# Patient Record
Sex: Female | Born: 1975 | Race: White | Hispanic: No | Smoking: Former smoker
Health system: Southern US, Community
[De-identification: ages and names within clinical notes are randomized; demographics above are authoritative.]

## PROBLEM LIST (undated history)

## (undated) HISTORY — PX: TONSILLECTOMY: SUR1361

## (undated) HISTORY — PX: AUGMENTATION MAMMAPLASTY: SUR837

## (undated) HISTORY — PX: CERVIX SURGERY: SHX593

## (undated) HISTORY — PX: OVARIAN CYST REMOVAL: SHX89

---

## 1999-09-22 ENCOUNTER — Other Ambulatory Visit: Admission: RE | Admit: 1999-09-22 | Discharge: 1999-09-22 | Payer: Self-pay | Admitting: Obstetrics and Gynecology

## 2000-01-17 ENCOUNTER — Inpatient Hospital Stay (HOSPITAL_COMMUNITY): Admission: AD | Admit: 2000-01-17 | Discharge: 2000-01-17 | Payer: Self-pay | Admitting: Gynecology

## 2000-01-26 ENCOUNTER — Other Ambulatory Visit: Admission: RE | Admit: 2000-01-26 | Discharge: 2000-01-26 | Payer: Self-pay | Admitting: Gynecology

## 2000-02-11 ENCOUNTER — Encounter: Payer: Self-pay | Admitting: Gynecology

## 2000-02-11 ENCOUNTER — Inpatient Hospital Stay (HOSPITAL_COMMUNITY): Admission: AD | Admit: 2000-02-11 | Discharge: 2000-02-11 | Payer: Self-pay | Admitting: Gynecology

## 2000-08-25 ENCOUNTER — Inpatient Hospital Stay (HOSPITAL_COMMUNITY): Admission: AD | Admit: 2000-08-25 | Discharge: 2000-08-26 | Payer: Self-pay | Admitting: Gynecology

## 2000-10-19 ENCOUNTER — Emergency Department (HOSPITAL_COMMUNITY): Admission: EM | Admit: 2000-10-19 | Discharge: 2000-10-20 | Payer: Self-pay | Admitting: Emergency Medicine

## 2000-12-02 ENCOUNTER — Other Ambulatory Visit: Admission: RE | Admit: 2000-12-02 | Discharge: 2000-12-02 | Payer: Self-pay | Admitting: Gynecology

## 2002-12-31 ENCOUNTER — Other Ambulatory Visit: Admission: RE | Admit: 2002-12-31 | Discharge: 2002-12-31 | Payer: Self-pay | Admitting: Gynecology

## 2004-06-16 ENCOUNTER — Other Ambulatory Visit: Admission: RE | Admit: 2004-06-16 | Discharge: 2004-06-16 | Payer: Self-pay | Admitting: Gynecology

## 2005-09-15 ENCOUNTER — Other Ambulatory Visit: Admission: RE | Admit: 2005-09-15 | Discharge: 2005-09-15 | Payer: Self-pay | Admitting: Gynecology

## 2005-11-18 ENCOUNTER — Ambulatory Visit (HOSPITAL_BASED_OUTPATIENT_CLINIC_OR_DEPARTMENT_OTHER): Admission: RE | Admit: 2005-11-18 | Discharge: 2005-11-18 | Payer: Self-pay | Admitting: Gynecology

## 2006-09-20 ENCOUNTER — Other Ambulatory Visit: Admission: RE | Admit: 2006-09-20 | Discharge: 2006-09-20 | Payer: Self-pay | Admitting: Gynecology

## 2013-01-13 LAB — HM PAP SMEAR

## 2013-09-25 ENCOUNTER — Ambulatory Visit (INDEPENDENT_AMBULATORY_CARE_PROVIDER_SITE_OTHER): Payer: 59 | Admitting: Family Medicine

## 2013-09-25 ENCOUNTER — Encounter: Payer: Self-pay | Admitting: Family Medicine

## 2013-09-25 VITALS — BP 108/74 | HR 83 | Temp 98.5°F | Wt 125.4 lb

## 2013-09-25 DIAGNOSIS — H10021 Other mucopurulent conjunctivitis, right eye: Secondary | ICD-10-CM

## 2013-09-25 DIAGNOSIS — H10029 Other mucopurulent conjunctivitis, unspecified eye: Secondary | ICD-10-CM

## 2013-09-25 MED ORDER — MOXIFLOXACIN HCL 0.5 % OP SOLN: 1.0000 [drp] | Freq: Three times a day (TID) | OPHTHALMIC | Status: DC

## 2013-09-25 NOTE — Progress Notes (Signed)
  Subjective:    Nicole Drake is a 37 y.o. female who presents for evaluation of discharge, erythema and itching in the right eye. She has noticed the above symptoms for a few days. Onset was gradual. Patient denies blurred vision, foreign body sensation, pain, photophobia, tearing and visual field deficit. There is a history of contact lens use.  The following portions of the patient's history were reviewed and updated as appropriate: allergies, current medications, past family history, past medical history, past social history, past surgical history and problem list.  Review of Systems Pertinent items are noted in HPI.   Objective:    BP 108/74  Pulse 83  Temp(Src) 98.5 F (36.9 C) (Oral)  Wt 125 lb 6.4 oz (56.881 kg)  SpO2 99%      General: alert, cooperative, appears stated age and no distress  Eyes:  positive findings: sclera injected with d/c   Vision: Not performed  Fluorescein:  not done---because pt had contacts in and no case     Assessment:    Acute conjunctivitis   Plan:    Discussed the diagnosis and proper care of conjunctivitis.  Stressed household Presenter, broadcasting. Ophthalmic drops per orders. Warm compress to eye(s). Local eye care discussed.  If no better or worse in 2-3 days --refer to oph

## 2013-09-25 NOTE — Patient Instructions (Signed)
Bacterial Conjunctivitis  Bacterial conjunctivitis, commonly called pink eye, is an inflammation of the clear membrane that covers the white part of the eye (conjunctiva). The inflammation can also happen on the underside of the eyelids. The blood vessels in the conjunctiva become inflamed causing the eye to become red or pink. Bacterial conjunctivitis may spread easily from one eye to another and from person to person (contagious).   CAUSES   Bacterial conjunctivitis is caused by bacteria. The bacteria may come from your own skin, your upper respiratory tract, or from someone else with bacterial conjunctivitis.  SYMPTOMS   The normally white color of the eye or the underside of the eyelid is usually pink or red. The pink eye is usually associated with irritation, tearing, and some sensitivity to light. Bacterial conjunctivitis is often associated with a thick, yellowish discharge from the eye. The discharge may turn into a crust on the eyelids overnight, which causes your eyelids to stick together. If a discharge is present, there may also be some blurred vision in the affected eye.  DIAGNOSIS   Bacterial conjunctivitis is diagnosed by your caregiver through an eye exam and the symptoms that you report. Your caregiver looks for changes in the surface tissues of your eyes, which may point to the specific type of conjunctivitis. A sample of any discharge may be collected on a cotton-tip swab if you have a severe case of conjunctivitis, if your cornea is affected, or if you keep getting repeat infections that do not respond to treatment. The sample will be sent to a lab to see if the inflammation is caused by a bacterial infection and to see if the infection will respond to antibiotic medicines.  TREATMENT   · Bacterial conjunctivitis is treated with antibiotics. Antibiotic eyedrops are most often used. However, antibiotic ointments are also available. Antibiotics pills are sometimes used. Artificial tears or eye  washes may ease discomfort.  HOME CARE INSTRUCTIONS   · To ease discomfort, apply a cool, clean wash cloth to your eye for 10 20 minutes, 3 4 times a day.  · Gently wipe away any drainage from your eye with a warm, wet washcloth or a cotton ball.  · Wash your hands often with soap and water. Use paper towels to dry your hands.  · Do not share towels or wash cloths. This may spread the infection.  · Change or wash your pillow case every day.  · You should not use eye makeup until the infection is gone.  · Do not operate machinery or drive if your vision is blurred.  · Stop using contacts lenses. Ask your caregiver how to sterilize or replace your contacts before using them again. This depends on the type of contact lenses that you use.  · When applying medicine to the infected eye, do not touch the edge of your eyelid with the eyedrop bottle or ointment tube.  SEEK IMMEDIATE MEDICAL CARE IF:   · Your infection has not improved within 3 days after beginning treatment.  · You had yellow discharge from your eye and it returns.  · You have increased eye pain.  · Your eye redness is spreading.  · Your vision becomes blurred.  · You have a fever or persistent symptoms for more than 2 3 days.  · You have a fever and your symptoms suddenly get worse.  · You have facial pain, redness, or swelling.  MAKE SURE YOU:   · Understand these instructions.  · Will watch your   condition.  · Will get help right away if you are not doing well or get worse.  Document Released: 11/29/2005 Document Revised: 08/23/2012 Document Reviewed: 05/01/2012  [employer]® Patient Information ©2014 [employer].

## 2013-11-28 ENCOUNTER — Telehealth: Payer: Self-pay

## 2013-11-28 NOTE — Telephone Encounter (Signed)
Medication and allergies:  Reviewed and updated  90 day supply/mail order: na Local pharmacy: [facility]   Immunizations due:  Declines flu vaccine  A/P:   History Entered as indicated Pap--< 1 year ago--[facility] [place] Tdap--unsure of last one  To Discuss with Provider: Not at this time

## 2013-11-29 ENCOUNTER — Ambulatory Visit (INDEPENDENT_AMBULATORY_CARE_PROVIDER_SITE_OTHER): Payer: 59 | Admitting: Family Medicine

## 2013-11-29 ENCOUNTER — Encounter: Payer: Self-pay | Admitting: Family Medicine

## 2013-11-29 VITALS — BP 92/60 | HR 70 | Temp 98.1°F | Ht 63.0 in | Wt 125.0 lb

## 2013-11-29 DIAGNOSIS — Z0000 Encounter for general adult medical examination without abnormal findings: Secondary | ICD-10-CM

## 2013-11-29 DIAGNOSIS — N390 Urinary tract infection, site not specified: Secondary | ICD-10-CM

## 2013-11-29 DIAGNOSIS — F17200 Nicotine dependence, unspecified, uncomplicated: Secondary | ICD-10-CM

## 2013-11-29 LAB — POCT URINALYSIS DIPSTICK
Bilirubin, UA: NEGATIVE
Glucose, UA: NEGATIVE
Ketones, UA: NEGATIVE
Leukocytes, UA: NEGATIVE
Nitrite, UA: NEGATIVE
Protein, UA: NEGATIVE
Spec Grav, UA: <=1.005
Urobilinogen, UA: 0.2
pH, UA: 6

## 2013-11-29 LAB — HEPATIC FUNCTION PANEL
ALT: 15 U/L (ref 0–35)
AST: 20 U/L (ref 0–37)
Albumin: 4.5 g/dL (ref 3.5–5.2)
Alkaline Phosphatase: 37 U/L — ABNORMAL LOW (ref 39–117)
Bilirubin, Direct: 0.1 mg/dL (ref 0.0–0.3)
Total Bilirubin: 0.6 mg/dL (ref 0.3–1.2)
Total Protein: 7.5 g/dL (ref 6.0–8.3)

## 2013-11-29 LAB — LIPID PANEL
Cholesterol: 134 mg/dL (ref 0–200)
HDL: 57.3 mg/dL (ref >39.00)
LDL Cholesterol: 66 mg/dL (ref 0–99)
Total CHOL/HDL Ratio: 2
Triglycerides: 53 mg/dL (ref 0.0–149.0)
VLDL: 10.6 mg/dL (ref 0.0–40.0)

## 2013-11-29 LAB — CBC WITH DIFFERENTIAL/PLATELET
Basophils Absolute: 0 10*3/uL (ref 0.0–0.1)
Basophils Relative: 0.9 % (ref 0.0–3.0)
Eosinophils Absolute: 0.1 10*3/uL (ref 0.0–0.7)
Eosinophils Relative: 2.2 % (ref 0.0–5.0)
HCT: 37.9 % (ref 36.0–46.0)
Hemoglobin: 13.1 g/dL (ref 12.0–15.0)
Lymphocytes Relative: 33.6 % (ref 12.0–46.0)
Lymphs Abs: 1.4 10*3/uL (ref 0.7–4.0)
MCHC: 34.7 g/dL (ref 30.0–36.0)
MCV: 94.5 fl (ref 78.0–100.0)
Monocytes Absolute: 0.5 10*3/uL (ref 0.1–1.0)
Monocytes Relative: 10.9 % (ref 3.0–12.0)
Neutro Abs: 2.2 10*3/uL (ref 1.4–7.7)
Neutrophils Relative %: 52.4 % (ref 43.0–77.0)
Platelets: 242 10*3/uL (ref 150.0–400.0)
RBC: 4.01 Mil/uL (ref 3.87–5.11)
RDW: 12.5 % (ref 11.5–14.6)
WBC: 4.2 10*3/uL — ABNORMAL LOW (ref 4.5–10.5)

## 2013-11-29 LAB — BASIC METABOLIC PANEL
BUN: 13 mg/dL (ref 6–23)
CO2: 26 mEq/L (ref 19–32)
Calcium: 9.1 mg/dL (ref 8.4–10.5)
Chloride: 106 mEq/L (ref 96–112)
Creatinine, Ser: 0.7 mg/dL (ref 0.4–1.2)
GFR: 108.02 mL/min (ref >60.00)
Glucose, Bld: 79 mg/dL (ref 70–99)
Potassium: 3.7 mEq/L (ref 3.5–5.1)
Sodium: 139 mEq/L (ref 135–145)

## 2013-11-29 LAB — TSH: TSH: 1.65 u[IU]/mL (ref 0.35–5.50)

## 2013-11-29 NOTE — Progress Notes (Signed)
Pre visit review using our clinic review tool, if applicable. No additional management support is needed unless otherwise documented below in the visit note. 

## 2013-11-29 NOTE — Progress Notes (Signed)
Subjective:     Nicole Drake is a 37 y.o. female and is here for a comprehensive physical exam. The patient reports no problems.  History   Social History  . Marital Status: Divorced    Spouse Name: N/A    Number of Children: N/A  . Years of Education: N/A   Occupational History  . Not on file.   Social History Main Topics  . Smoking status: Current Every Day Smoker -- 0.25 packs/day for 18 years    Types: Cigarettes  . Smokeless tobacco: Never Used  . Alcohol Use: Yes     Comment: OCC  . Drug Use: No  . Sexual Activity: Not on file   Other Topics Concern  . Not on file   Social History Narrative  . No narrative on file   Health Maintenance  Topic Date Due  . Influenza Vaccine  11/29/2014  . Tetanus/tdap  12/14/2015  . Pap Smear  01/14/2016      She  reports that she has been smoking Cigarettes.  She has a 4.5 pack-year smoking history. She has never used smokeless tobacco. She reports that she drinks alcohol. She reports that she does not use illicit drugs. She currently has no medications in their medication list. No current outpatient prescriptions on file prior to visit.   No current facility-administered medications on file prior to visit.   She is allergic to penicillins..  Review of Systems Review of Systems  Constitutional: Negative for activity change, appetite change and fatigue.  HENT: Negative for hearing loss, congestion, tinnitus and ear discharge.  dentist q23m Eyes: Negative for visual disturbance (see optho q1y -- vision corrected to 20/20 with glasses).  Respiratory: Negative for cough, chest tightness and shortness of breath.   Cardiovascular: Negative for chest pain, palpitations and leg swelling.  Gastrointestinal: Negative for abdominal pain, diarrhea, constipation and abdominal distention.  Genitourinary: Negative for urgency, frequency, decreased urine volume and difficulty urinating.  Musculoskeletal: Negative for back pain,  arthralgias and gait problem.  Skin: Negative for color change, pallor and rash.  Neurological: Negative for dizziness, light-headedness, numbness and headaches.  Hematological: Negative for adenopathy. Does not bruise/bleed easily.  Psychiatric/Behavioral: Negative for suicidal ideas, confusion, sleep disturbance, self-injury, dysphoric mood, decreased concentration and agitation.       Objective:    BP 92/60  Pulse 70  Temp(Src) 98.1 F (36.7 C) (Oral)  Ht 5\' 3"  (1.6 m)  Wt 125 lb (56.7 kg)  BMI 22.15 kg/m2  SpO2 98% General appearance: alert, cooperative, appears stated age and no distress Head: Normocephalic, without obvious abnormality, atraumatic Eyes: conjunctivae/corneas clear. PERRL, EOM's intact. Fundi benign. Ears: normal TM's and external ear canals both ears Nose: Nares normal. Septum midline. Mucosa normal. No drainage or sinus tenderness. Throat: lips, mucosa, and tongue normal; teeth and gums normal Neck: no adenopathy, no carotid bruit, no JVD, supple, symmetrical, trachea midline and thyroid not enlarged, symmetric, no tenderness/mass/nodules Back: symmetric, no curvature. ROM normal. No CVA tenderness. Lungs: clear to auscultation bilaterally Breasts: gyn Heart: regular rate and rhythm, S1, S2 normal, no murmur, click, rub or gallop Abdomen: soft, non-tender; bowel sounds normal; no masses,  no organomegaly Pelvic: deferred--gyn Extremities: extremities normal, atraumatic, no cyanosis or edema Pulses: 2+ and symmetric Skin: Skin color, texture, turgor normal. No rashes or lesions Lymph nodes: Cervical, supraclavicular, and axillary nodes normal. Neurologic: Alert and oriented X 3, normal strength and tone. Normal symmetric reflexes. Normal coordination and gait Psych-- no depression, no anxiety  Assessment:    Healthy female exam.      Plan:    ghm utd Check labs See After Visit Summary for Counseling Recommendations

## 2013-11-29 NOTE — Addendum Note (Signed)
Addended by: Silvio Pate D on: 11/29/2013 02:51 PM   Modules accepted: Orders

## 2013-11-29 NOTE — Patient Instructions (Addendum)
Preventive Care for Adults, Female A healthy lifestyle and preventive care can promote health and wellness. Preventive health guidelines for women include the following key practices.  A routine yearly physical is a good way to check with your caregiver about your health and preventive screening. It is a chance to share any concerns and updates on your health, and to receive a thorough exam.  Visit your dentist for a routine exam and preventive care every 6 months. Brush your teeth twice a day and floss once a day. Good oral hygiene prevents tooth decay and gum disease.  The frequency of eye exams is based on your age, health, family medical history, use of contact lenses, and other factors. Follow your caregiver's recommendations for frequency of eye exams.  Eat a healthy diet. Foods like vegetables, fruits, whole grains, low-fat dairy products, and lean protein foods contain the nutrients you need without too many calories. Decrease your intake of foods high in solid fats, added sugars, and salt. Eat the right amount of calories for you.Get information about a proper diet from your caregiver, if necessary.  Regular physical exercise is one of the most important things you can do for your health. Most adults should get at least 150 minutes of moderate-intensity exercise (any activity that increases your heart rate and causes you to sweat) each week. In addition, most adults need muscle-strengthening exercises on 2 or more days a week.  Maintain a healthy weight. The body mass index (BMI) is a screening tool to identify possible weight problems. It provides an estimate of body fat based on height and weight. Your caregiver can help determine your BMI, and can help you achieve or maintain a healthy weight.For adults 20 years and older:  A BMI below 18.5 is considered underweight.  A BMI of 18.5 to 24.9 is normal.  A BMI of 25 to 29.9 is considered overweight.  A BMI of 30 and above is  considered obese.  Maintain normal blood lipids and cholesterol levels by exercising and minimizing your intake of saturated fat. Eat a balanced diet with plenty of fruit and vegetables. Blood tests for lipids and cholesterol should begin at age 20 and be repeated every 5 years. If your lipid or cholesterol levels are high, you are over 50, or you are at high risk for heart disease, you may need your cholesterol levels checked more frequently.Ongoing high lipid and cholesterol levels should be treated with medicines if diet and exercise are not effective.  If you smoke, find out from your caregiver how to quit. If you do not use tobacco, do not start.  Lung cancer screening is recommended for adults aged 55 80 years who are at high risk for developing lung cancer because of a history of smoking. Yearly low-dose computed tomography (CT) is recommended for people who have at least a 30-pack-year history of smoking and are a current smoker or have quit within the past 15 years. A pack year of smoking is smoking an average of 1 pack of cigarettes a day for 1 year (for example: 1 pack a day for 30 years or 2 packs a day for 15 years). Yearly screening should continue until the smoker has stopped smoking for at least 15 years. Yearly screening should also be stopped for people who develop a health problem that would prevent them from having lung cancer treatment.  If you are pregnant, do not drink alcohol. If you are breastfeeding, be very cautious about drinking alcohol. If you are   not pregnant and choose to drink alcohol, do not exceed 1 drink per day. One drink is considered to be 12 ounces (355 mL) of beer, 5 ounces (148 mL) of wine, or 1.5 ounces (44 mL) of liquor.  Avoid use of street drugs. Do not share needles with anyone. Ask for help if you need support or instructions about stopping the use of drugs.  High blood pressure causes heart disease and increases the risk of stroke. Your blood pressure  should be checked at least every 1 to 2 years. Ongoing high blood pressure should be treated with medicines if weight loss and exercise are not effective.  If you are 55 to 38 years old, ask your caregiver if you should take aspirin to prevent strokes.  Diabetes screening involves taking a blood sample to check your fasting blood sugar level. This should be done once every 3 years, after age 45, if you are within normal weight and without risk factors for diabetes. Testing should be considered at a younger age or be carried out more frequently if you are overweight and have at least 1 risk factor for diabetes.  Breast cancer screening is essential preventive care for women. You should practice "breast self-awareness." This means understanding the normal appearance and feel of your breasts and may include breast self-examination. Any changes detected, no matter how small, should be reported to a caregiver. Women in their 20s and 30s should have a clinical breast exam (CBE) by a caregiver as part of a regular health exam every 1 to 3 years. After age 40, women should have a CBE every year. Starting at age 40, women should consider having a mammography (breast X-ray test) every year. Women who have a family history of breast cancer should talk to their caregiver about genetic screening. Women at a high risk of breast cancer should talk to their caregivers about having magnetic resonance imaging (MRI) and a mammography every year.  Breast cancer gene (BRCA)-related cancer risk assessment is recommended for women who have family members with BRCA-related cancers. BRCA-related cancers include breast, ovarian, tubal, and peritoneal cancers. Having family members with these cancers may be associated with an increased risk for harmful changes (mutations) in the breast cancer genes BRCA1 and BRCA2. Results of the assessment will determine the need for genetic counseling and BRCA1 and BRCA2 testing.  The Pap test is  a screening test for cervical cancer. A Pap test can show cell changes on the cervix that might become cervical cancer if left untreated. A Pap test is a procedure in which cells are obtained and examined from the lower end of the uterus (cervix).  Women should have a Pap test starting at age 21.  Between ages 21 and 29, Pap tests should be repeated every 2 years.  Beginning at age 30, you should have a Pap test every 3 years as long as the past 3 Pap tests have been normal.  Some women have medical problems that increase the chance of getting cervical cancer. Talk to your caregiver about these problems. It is especially important to talk to your caregiver if a new problem develops soon after your last Pap test. In these cases, your caregiver may recommend more frequent screening and Pap tests.  The above recommendations are the same for women who have or have not gotten the vaccine for human papillomavirus (HPV).  If you had a hysterectomy for a problem that was not cancer or a condition that could lead to cancer, then   you no longer need Pap tests. Even if you no longer need a Pap test, a regular exam is a good idea to make sure no other problems are starting.  If you are between ages 79 and 19, and you have had normal Pap tests going back 10 years, you no longer need Pap tests. Even if you no longer need a Pap test, a regular exam is a good idea to make sure no other problems are starting.  If you have had past treatment for cervical cancer or a condition that could lead to cancer, you need Pap tests and screening for cancer for at least 20 years after your treatment.  If Pap tests have been discontinued, risk factors (such as a new sexual partner) need to be reassessed to determine if screening should be resumed.  The HPV test is an additional test that may be used for cervical cancer screening. The HPV test looks for the virus that can cause the cell changes on the cervix. The cells collected  during the Pap test can be tested for HPV. The HPV test could be used to screen women aged 90 years and older, and should be used in women of any age who have unclear Pap test results. After the age of 9, women should have HPV testing at the same frequency as a Pap test.  Colorectal cancer can be detected and often prevented. Most routine colorectal cancer screening begins at the age of 63 and continues through age 61. However, your caregiver may recommend screening at an earlier age if you have risk factors for colon cancer. On a yearly basis, your caregiver may provide home test kits to check for hidden blood in the stool. Use of a small camera at the end of a tube, to directly examine the colon (sigmoidoscopy or colonoscopy), can detect the earliest forms of colorectal cancer. Talk to your caregiver about this at age 32, when routine screening begins. Direct examination of the colon should be repeated every 5 to 10 years through age 7, unless early forms of pre-cancerous polyps or small growths are found.  Hepatitis C blood testing is recommended for all people born from [birth year] through 1965 and any individual with known risks for hepatitis C.  Practice safe sex. Use condoms and avoid high-risk sexual practices to reduce the spread of sexually transmitted infections (STIs). STIs include gonorrhea, chlamydia, syphilis, trichomonas, herpes, HPV, and human immunodeficiency virus (HIV). Herpes, HIV, and HPV are viral illnesses that have no cure. They can result in disability, cancer, and death. Sexually active women aged 7 and younger should be checked for chlamydia. Older women with new or multiple partners should also be tested for chlamydia. Testing for other STIs is recommended if you are sexually active and at increased risk.  Osteoporosis is a disease in which the bones lose minerals and strength with aging. This can result in serious bone fractures. The risk of osteoporosis can be identified using a  bone density scan. Women ages 30 and over and women at risk for fractures or osteoporosis should discuss screening with their caregivers. Ask your caregiver whether you should take a calcium supplement or vitamin D to reduce the rate of osteoporosis.  Menopause can be associated with physical symptoms and risks. Hormone replacement therapy is available to decrease symptoms and risks. You should talk to your caregiver about whether hormone replacement therapy is right for you.  Use sunscreen. Apply sunscreen liberally and repeatedly throughout the day. You should seek shade  when your shadow is shorter than you. Protect yourself by wearing long sleeves, pants, a wide-brimmed hat, and sunglasses year round, whenever you are outdoors.  Once a month, do a whole body skin exam, using a mirror to look at the skin on your back. Notify your caregiver of new moles, moles that have irregular borders, moles that are larger than a pencil eraser, or moles that have changed in shape or color.  Stay current with required immunizations.  Influenza vaccine. All adults should be immunized every year.  Tetanus, diphtheria, and acellular pertussis (Td, Tdap) vaccine. Pregnant women should receive 1 dose of Tdap vaccine during each pregnancy. The dose should be obtained regardless of the length of time since the last dose. Immunization is preferred during the 27th to 36th week of gestation. An adult who has not previously received Tdap or who does not know her vaccine status should receive 1 dose of Tdap. This initial dose should be followed by tetanus and diphtheria toxoids (Td) booster doses every 10 years. Adults with an unknown or incomplete history of completing a 3-dose immunization series with Td-containing vaccines should begin or complete a primary immunization series including a Tdap dose. Adults should receive a Td booster every 10 years.  Varicella vaccine. An adult without evidence of immunity to varicella  should receive 2 doses or a second dose if she has previously received 1 dose. Pregnant females who do not have evidence of immunity should receive the first dose after pregnancy. This first dose should be obtained before leaving the health care facility. The second dose should be obtained 4 8 weeks after the first dose.  Human papillomavirus (HPV) vaccine. Females aged 13 26 years who have not received the vaccine previously should obtain the 3-dose series. The vaccine is not recommended for use in pregnant females. However, pregnancy testing is not needed before receiving a dose. If a female is found to be pregnant after receiving a dose, no treatment is needed. In that case, the remaining doses should be delayed until after the pregnancy. Immunization is recommended for any person with an immunocompromised condition through the age of 26 years if she did not get any or all doses earlier. During the 3-dose series, the second dose should be obtained 4 8 weeks after the first dose. The third dose should be obtained 24 weeks after the first dose and 16 weeks after the second dose.  Zoster vaccine. One dose is recommended for adults aged 60 years or older unless certain conditions are present.  Measles, mumps, and rubella (MMR) vaccine. Adults born before 1957 generally are considered immune to measles and mumps. Adults born in 1957 or later should have 1 or more doses of MMR vaccine unless there is a contraindication to the vaccine or there is laboratory evidence of immunity to each of the three diseases. A routine second dose of MMR vaccine should be obtained at least 28 days after the first dose for students attending postsecondary schools, health care workers, or international travelers. People who received inactivated measles vaccine or an unknown type of measles vaccine during 1963 1967 should receive 2 doses of MMR vaccine. People who received inactivated mumps vaccine or an unknown type of mumps vaccine  before 1979 and are at high risk for mumps infection should consider immunization with 2 doses of MMR vaccine. For females of childbearing age, rubella immunity should be determined. If there is no evidence of immunity, females who are not pregnant should be vaccinated. If there   is no evidence of immunity, females who are pregnant should delay immunization until after pregnancy. Unvaccinated health care workers born before 1957 who lack laboratory evidence of measles, mumps, or rubella immunity or laboratory confirmation of disease should consider measles and mumps immunization with 2 doses of MMR vaccine or rubella immunization with 1 dose of MMR vaccine.  Pneumococcal 13-valent conjugate (PCV13) vaccine. When indicated, a person who is uncertain of her immunization history and has no record of immunization should receive the PCV13 vaccine. An adult aged 19 years or older who has certain medical conditions and has not been previously immunized should receive 1 dose of PCV13 vaccine. This PCV13 should be followed with a dose of pneumococcal polysaccharide (PPSV23) vaccine. The PPSV23 vaccine dose should be obtained at least 8 weeks after the dose of PCV13 vaccine. An adult aged 19 years or older who has certain medical conditions and previously received 1 or more doses of PPSV23 vaccine should receive 1 dose of PCV13. The PCV13 vaccine dose should be obtained 1 or more years after the last PPSV23 vaccine dose.  Pneumococcal polysaccharide (PPSV23) vaccine. When PCV13 is also indicated, PCV13 should be obtained first. All adults aged 65 years and older should be immunized. An adult younger than age 65 years who has certain medical conditions should be immunized. Any person who resides in a nursing home or long-term care facility should be immunized. An adult smoker should be immunized. People with an immunocompromised condition and certain other conditions should receive both PCV13 and PPSV23 vaccines. People  with human immunodeficiency virus (HIV) infection should be immunized as soon as possible after diagnosis. Immunization during chemotherapy or radiation therapy should be avoided. Routine use of PPSV23 vaccine is not recommended for American Indians, [place] Natives, or people younger than 65 years unless there are medical conditions that require PPSV23 vaccine. When indicated, people who have unknown immunization and have no record of immunization should receive PPSV23 vaccine. One-time revaccination 5 years after the first dose of PPSV23 is recommended for people aged 19 64 years who have chronic kidney failure, nephrotic syndrome, asplenia, or immunocompromised conditions. People who received 1 2 doses of PPSV23 before age 65 years should receive another dose of PPSV23 vaccine at age 65 years or later if at least 5 years have passed since the previous dose. Doses of PPSV23 are not needed for people immunized with PPSV23 at or after age 65 years.  Meningococcal vaccine. Adults with asplenia or persistent complement component deficiencies should receive 2 doses of quadrivalent meningococcal conjugate (MenACWY-D) vaccine. The doses should be obtained at least 2 months apart. Microbiologists working with certain meningococcal bacteria, military recruits, people at risk during an outbreak, and people who travel to or live in countries with a high rate of meningitis should be immunized. A first-year college student up through age 21 years who is living in a residence hall should receive a dose if she did not receive a dose on or after her 16th birthday. Adults who have certain high-risk conditions should receive one or more doses of vaccine.  Hepatitis A vaccine. Adults who wish to be protected from this disease, have certain high-risk conditions, work with hepatitis A-infected animals, work in hepatitis A research labs, or travel to or work in countries with a high rate of hepatitis A should be immunized. Adults  who were previously unvaccinated and who anticipate close contact with an international adoptee during the first 60 days after arrival in the [place] from a country   with a high rate of hepatitis A should be immunized.  Hepatitis B vaccine. Adults who wish to be protected from this disease, have certain high-risk conditions, may be exposed to blood or other infectious body fluids, are household contacts or sex partners of hepatitis B positive people, are clients or workers in certain care facilities, or travel to or work in countries with a high rate of hepatitis B should be immunized.  Haemophilus influenzae type b (Hib) vaccine. A previously unvaccinated person with asplenia or sickle cell disease or having a scheduled splenectomy should receive 1 dose of Hib vaccine. Regardless of previous immunization, a recipient of a hematopoietic stem cell transplant should receive a 3-dose series 6 12 months after her successful transplant. Hib vaccine is not recommended for adults with HIV infection. Preventive Services / Frequency Ages 19 to 39  Blood pressure check.** / Every 1 to 2 years.  Lipid and cholesterol check.** / Every 5 years beginning at age 20.  Clinical breast exam.** / Every 3 years for women in their 20s and 30s.  BRCA-related cancer risk assessment.** / For women who have family members with a BRCA-related cancer (breast, ovarian, tubal, or peritoneal cancers).  Pap test.** / Every 2 years from ages 21 through 29. Every 3 years starting at age 30 through age 65 or 70 with a history of 3 consecutive normal Pap tests.  HPV screening.** / Every 3 years from ages 30 through ages 65 to 70 with a history of 3 consecutive normal Pap tests.  Hepatitis C blood test.** / For any individual with known risks for hepatitis C.  Skin self-exam. / Monthly.  Influenza vaccine. / Every year.  Tetanus, diphtheria, and acellular pertussis (Tdap, Td) vaccine.** / Consult your caregiver. Pregnant  women should receive 1 dose of Tdap vaccine during each pregnancy. 1 dose of Td every 10 years.  Varicella vaccine.** / Consult your caregiver. Pregnant females who do not have evidence of immunity should receive the first dose after pregnancy.  HPV vaccine. / 3 doses over 6 months, if 26 and younger. The vaccine is not recommended for use in pregnant females. However, pregnancy testing is not needed before receiving a dose.  Measles, mumps, rubella (MMR) vaccine.** / You need at least 1 dose of MMR if you were born in 1957 or later. You may also need a 2nd dose. For females of childbearing age, rubella immunity should be determined. If there is no evidence of immunity, females who are not pregnant should be vaccinated. If there is no evidence of immunity, females who are pregnant should delay immunization until after pregnancy.  Pneumococcal 13-valent conjugate (PCV13) vaccine.** / Consult your caregiver.  Pneumococcal polysaccharide (PPSV23) vaccine.** / 1 to 2 doses if you smoke cigarettes or if you have certain conditions.  Meningococcal vaccine.** / 1 dose if you are age 19 to 21 years and a first-year college student living in a residence hall, or have one of several medical conditions, you need to get vaccinated against meningococcal disease. You may also need additional booster doses.  Hepatitis A vaccine.** / Consult your caregiver.  Hepatitis B vaccine.** / Consult your caregiver.  Haemophilus influenzae type b (Hib) vaccine.** / Consult your caregiver. Ages 40 to 64  Blood pressure check.** / Every 1 to 2 years.  Lipid and cholesterol check.** / Every 5 years beginning at age 20.  Lung cancer screening. / Every year if you are aged 55 80 years and have a 30-pack-year history of smoking and   currently smoke or have quit within the past 15 years. Yearly screening is stopped once you have quit smoking for at least 15 years or develop a health problem that would prevent you from having  lung cancer treatment.  Clinical breast exam.** / Every year after age 40.  BRCA-related cancer risk assessment.** / For women who have family members with a BRCA-related cancer (breast, ovarian, tubal, or peritoneal cancers).  Mammogram.** / Every year beginning at age 40 and continuing for as long as you are in good health. Consult with your caregiver.  Pap test.** / Every 3 years starting at age 30 through age 65 or 70 with a history of 3 consecutive normal Pap tests.  HPV screening.** / Every 3 years from ages 30 through ages 65 to 70 with a history of 3 consecutive normal Pap tests.  Fecal occult blood test (FOBT) of stool. / Every year beginning at age 50 and continuing until age 75. You may not need to do this test if you get a colonoscopy every 10 years.  Flexible sigmoidoscopy or colonoscopy.** / Every 5 years for a flexible sigmoidoscopy or every 10 years for a colonoscopy beginning at age 50 and continuing until age 75.  Hepatitis C blood test.** / For all people born from 1945 through 1965 and any individual with known risks for hepatitis C.  Skin self-exam. / Monthly.  Influenza vaccine. / Every year.  Tetanus, diphtheria, and acellular pertussis (Tdap/Td) vaccine.** / Consult your caregiver. Pregnant women should receive 1 dose of Tdap vaccine during each pregnancy. 1 dose of Td every 10 years.  Varicella vaccine.** / Consult your caregiver. Pregnant females who do not have evidence of immunity should receive the first dose after pregnancy.  Zoster vaccine.** / 1 dose for adults aged 60 years or older.  Measles, mumps, rubella (MMR) vaccine.** / You need at least 1 dose of MMR if you were born in 1957 or later. You may also need a 2nd dose. For females of childbearing age, rubella immunity should be determined. If there is no evidence of immunity, females who are not pregnant should be vaccinated. If there is no evidence of immunity, females who are pregnant should delay  immunization until after pregnancy.  Pneumococcal 13-valent conjugate (PCV13) vaccine.** / Consult your caregiver.  Pneumococcal polysaccharide (PPSV23) vaccine.** / 1 to 2 doses if you smoke cigarettes or if you have certain conditions.  Meningococcal vaccine.** / Consult your caregiver.  Hepatitis A vaccine.** / Consult your caregiver.  Hepatitis B vaccine.** / Consult your caregiver.  Haemophilus influenzae type b (Hib) vaccine.** / Consult your caregiver. Ages 65 and over  Blood pressure check.** / Every 1 to 2 years.  Lipid and cholesterol check.** / Every 5 years beginning at age 20.  Lung cancer screening. / Every year if you are aged 55 80 years and have a 30-pack-year history of smoking and currently smoke or have quit within the past 15 years. Yearly screening is stopped once you have quit smoking for at least 15 years or develop a health problem that would prevent you from having lung cancer treatment.  Clinical breast exam.** / Every year after age 40.  BRCA-related cancer risk assessment.** / For women who have family members with a BRCA-related cancer (breast, ovarian, tubal, or peritoneal cancers).  Mammogram.** / Every year beginning at age 40 and continuing for as long as you are in good health. Consult with your caregiver.  Pap test.** / Every 3 years starting at age   30 through age 45 or 28 with a 3 consecutive normal Pap tests. Testing can be stopped between 65 and 70 with 3 consecutive normal Pap tests and no abnormal Pap or HPV tests in the past 10 years.  HPV screening.** / Every 3 years from ages 77 through ages 62 or 11 with a history of 3 consecutive normal Pap tests. Testing can be stopped between 65 and 70 with 3 consecutive normal Pap tests and no abnormal Pap or HPV tests in the past 10 years.  Fecal occult blood test (FOBT) of stool. / Every year beginning at age 35 and continuing until age 101. You may not need to do this test if you get a colonoscopy  every 10 years.  Flexible sigmoidoscopy or colonoscopy.** / Every 5 years for a flexible sigmoidoscopy or every 10 years for a colonoscopy beginning at age 37 and continuing until age 67.  Hepatitis C blood test.** / For all people born from [birth year] through 1966 and any individual with known risks for hepatitis C.  Osteoporosis screening.** / A one-time screening for women ages 77 and over and women at risk for fractures or osteoporosis.  Skin self-exam. / Monthly.  Influenza vaccine. / Every year.  Tetanus, diphtheria, and acellular pertussis (Tdap/Td) vaccine.** / 1 dose of Td every 10 years.  Varicella vaccine.** / Consult your caregiver.  Zoster vaccine.** / 1 dose for adults aged 69 years or older.  Pneumococcal 13-valent conjugate (PCV13) vaccine.** / Consult your caregiver.  Pneumococcal polysaccharide (PPSV23) vaccine.** / 1 dose for all adults aged 57 years and older.  Meningococcal vaccine.** / Consult your caregiver.  Hepatitis A vaccine.** / Consult your caregiver.  Hepatitis B vaccine.** / Consult your caregiver.  Haemophilus influenzae type b (Hib) vaccine.** / Consult your caregiver. ** Family history and personal history of risk and conditions may change your caregiver's recommendations. Document Released: 01/25/2002 Document Revised: 03/26/2013 Document Reviewed: 04/26/2011 [facility] Patient Information 2014 [place], [place].   Smoking Cessation, Tips for Success YOU CAN QUIT SMOKING If you are ready to quit smoking, congratulations! You have chosen to help yourself be healthier. Cigarettes bring nicotine, tar, carbon monoxide, and other irritants into your body. Your lungs, heart, and blood vessels will be able to work better without these poisons. There are many different ways to quit smoking. Nicotine gum, nicotine patches, a nicotine inhaler, or nicotine nasal spray can help with physical craving. Hypnosis, support groups, and medicines help break the habit of  smoking. Here are some tips to help you quit for good. Throw away all cigarettes. Clean and remove all ashtrays from your home, work, and car. On a card, write down your reasons for quitting. Carry the card with you and read it when you get the urge to smoke. Cleanse your body of nicotine. Drink enough water and fluids to keep your urine clear or pale yellow. Do this after quitting to flush the nicotine from your body. Learn to predict your moods. Do not let a bad situation be your excuse to have a cigarette. Some situations in your life might tempt you into wanting a cigarette. Never have "just one" cigarette. It leads to wanting another and another. Remind yourself of your decision to quit. Change habits associated with smoking. If you smoked while driving or when feeling stressed, try other activities to replace smoking. Stand up when drinking your coffee. Brush your teeth after eating. Sit in a different chair when you read the paper. Avoid alcohol while trying to quit,  and try to drink fewer caffeinated beverages. Alcohol and caffeine may urge you to smoke. Avoid foods and drinks that can trigger a desire to smoke, such as sugary or spicy foods and alcohol. Ask people who smoke not to smoke around you. Have something planned to do right after eating or having a cup of coffee. Take a walk or exercise to perk you up. This will help to keep you from overeating. Try a relaxation exercise to calm you down and decrease your stress. Remember, you may be tense and nervous for the first 2 weeks after you quit, but this will pass. Find new activities to keep your hands busy. Play with a pen, coin, or rubber band. Doodle or draw things on paper. Brush your teeth right after eating. This will help cut down on the craving for the taste of tobacco after meals. You can try mouthwash, too. Use oral substitutes, such as lemon drops, carrots, a cinnamon stick, or chewing gum, in place of cigarettes. Keep them handy  so they are available when you have the urge to smoke. When you have the urge to smoke, try deep breathing. Designate your home as a nonsmoking area. If you are a heavy smoker, ask your caregiver about a prescription for nicotine chewing gum. It can ease your withdrawal from nicotine. Reward yourself. Set aside the cigarette money you save and buy yourself something nice. Look for support from others. Join a support group or smoking cessation program. Ask someone at home or at work to help you with your plan to quit smoking. Always ask yourself, "Do I need this cigarette or is this just a reflex?" Tell yourself, "Today, I choose not to smoke," or "I do not want to smoke." You are reminding yourself of your decision to quit, even if you do smoke a cigarette. HOW WILL I FEEL WHEN I QUIT SMOKING? The benefits of not smoking start within days of quitting. You may have symptoms of withdrawal because your body is used to nicotine (the addictive substance in cigarettes). You may crave cigarettes, be irritable, feel very hungry, cough often, get headaches, or have difficulty concentrating. The withdrawal symptoms are only temporary. They are strongest when you first quit but will go away within 10 to 14 days. When withdrawal symptoms occur, stay in control. Think about your reasons for quitting. Remind yourself that these are signs that your body is healing and getting used to being without cigarettes. Remember that withdrawal symptoms are easier to treat than the major diseases that smoking can cause. Even after the withdrawal is over, expect periodic urges to smoke. However, these cravings are generally short-lived and will go away whether you smoke or not. Do not smoke! If you relapse and smoke again, do not lose hope. Most smokers quit 3 times before they are successful. If you relapse, do not give up! Plan ahead and think about what you will do the next time you get the urge to smoke. LIFE AS A  NONSMOKER: MAKE IT FOR A MONTH, MAKE IT FOR LIFE Day 1: Hang this page where you will see it every day. Day 2: Get rid of all ashtrays, matches, and lighters. Day 3: Drink water. Breathe deeply between sips. Day 4: Avoid places with smoke-filled air, such as bars, clubs, or the smoking section of restaurants. Day 5: Keep track of how much money you save by not smoking. Day 6: Avoid boredom. Keep a good book with you or go to the movies. Day 7:  Reward yourself! One week without smoking! Day 8: Make a dental appointment to get your teeth cleaned. Day 9: Decide how you will turn down a cigarette before it is offered to you. Day 10: Review your reasons for quitting. Day 11: Distract yourself. Stay active to keep your mind off smoking and to relieve tension. Take a walk, exercise, read a book, do a crossword puzzle, or try a new hobby. Day 12: Exercise. Get off the bus before your stop or use stairs instead of escalators. Day 13: Call on friends for support and encouragement. Day 14: Reward yourself! Two weeks without smoking! Day 15: Practice deep breathing exercises. Day 16: Bet a friend that you can stay a nonsmoker. Day 17: Ask to sit in nonsmoking sections of restaurants. Day 18: Hang up "No Smoking" signs. Day 19: Think of yourself as a nonsmoker. Day 20: Each morning, tell yourself you will not smoke. Day 21: Reward yourself! Three weeks without smoking! Day 22: Think of smoking in negative ways. Remember how it stains your teeth, gives you bad breath, and leaves you short of breath. Day 23: Eat a nutritious breakfast. Day 24:Do not relive your days as a smoker. Day 25: Hold a pencil in your hand when talking on the telephone. Day 26: Tell all your friends you do not smoke. Day 27: Think about how much better food tastes. Day 28: Remember, one cigarette is one too many. Day 29: Take up a hobby that will keep your hands busy. Day 30: Congratulations! One month without smoking! Give  yourself a big reward. Your caregiver can direct you to community resources or hospitals for support, which may include: Group support. Education. Hypnosis. Subliminal therapy. Document Released: 08/27/2004 Document Revised: 02/21/2012 Document Reviewed: 05/17/2013 [facility] Patient Information 2014 [place], [place].

## 2013-11-30 LAB — URINE CULTURE
Colony Count: NO GROWTH
Organism ID, Bacteria: NO GROWTH

## 2014-05-13 ENCOUNTER — Telehealth: Payer: Self-pay | Admitting: Family Medicine

## 2014-05-13 ENCOUNTER — Encounter: Payer: Self-pay | Admitting: Family Medicine

## 2014-05-13 ENCOUNTER — Ambulatory Visit (INDEPENDENT_AMBULATORY_CARE_PROVIDER_SITE_OTHER): Payer: 59 | Admitting: Family Medicine

## 2014-05-13 VITALS — BP 100/68 | HR 79 | Temp 98.6°F | Wt 125.0 lb

## 2014-05-13 DIAGNOSIS — H18829 Corneal disorder due to contact lens, unspecified eye: Secondary | ICD-10-CM

## 2014-05-13 MED ORDER — MOXIFLOXACIN HCL 0.5 % OP SOLN: 1.0000 [drp] | Freq: Three times a day (TID) | OPHTHALMIC | Status: DC

## 2014-05-13 NOTE — Progress Notes (Signed)
   Subjective:    Patient ID: Nicole Drake, female    DOB: 1976/09/13, 38 y.o.   MRN: [id number]  Eye Problem       Review of Systems     Objective:   Physical Exam  Eyes:            Assessment & Plan:   Subjective:    Nicole Drake is a 38 y.o. female who presents for evaluation of erythema, foreign body sensation, itching and tearing in both eyes. She has noticed the above symptoms for 6 days. Onset was sudden. Patient denies blurred vision, discharge, photophobia and visual field deficit. There is a history of contact lens use.  The following portions of the patient's history were reviewed and updated as appropriate: allergies, current medications, past family history, past medical history, past social history, past surgical history and problem list.  Review of Systems Pertinent items are noted in HPI.   Objective:    BP 100/68  Pulse 79  Temp(Src) 98.6 F (37 C) (Oral)  Wt 125 lb (56.7 kg)  SpO2 98%  LMP 04/22/2014      General: alert, cooperative, appears stated age and no distress  Eyes:  conjunctivae/corneas clear. PERRL, EOM's intact. Fundi benign.  Vision: Not performed  Fluorescein:  positive uptake b/L     Assessment:    Corneal abrasion   Plan:    Ophthalmic drops per orders. Warm compress to eye(s). Local eye care discussed.  F/u opth if no better by [facility]

## 2014-05-13 NOTE — Patient Instructions (Signed)
Corneal Abrasion  The cornea is the clear covering at the front and center of the eye. When looking at the colored portion of the eye (iris), you are looking through the cornea. This very thin tissue is made up of many layers. The surface layer is a single layer of cells (corneal epithelium) and is one of the most sensitive tissues in the body. If a scratch or injury causes the corneal epithelium to come off, it is called a corneal abrasion. If the injury extends to the tissues below the epithelium, the condition is called a corneal ulcer.  CAUSES    Scratches.   Trauma.   Foreign body in the eye.  Some people have recurrences of abrasions in the area of the original injury even after it has healed (recurrent erosion syndrome). Recurrent erosion syndrome generally improves and goes away with time.  SYMPTOMS    Eye pain.   Difficulty or inability to keep the injured eye open.   The eye becomes very sensitive to light.   Recurrent erosions tend to happen suddenly, first thing in the morning, usually after waking up and opening the eye.  DIAGNOSIS   Your health care provider can diagnose a corneal abrasion during an eye exam. Dye is usually placed in the eye using a drop or a small paper strip moistened by your tears. When the eye is examined with a special light, the abrasion shows up clearly because of the dye.  TREATMENT    Small abrasions may be treated with antibiotic drops or ointment alone.   Usually a pressure patch is specially applied. Pressure patches prevent the eye from blinking, allowing the corneal epithelium to heal. A pressure patch also reduces the amount of pain present in the eye during healing. Most corneal abrasions heal within 2 3 days with no effect on vision.  If the abrasion becomes infected and spreads to the deeper tissues of the cornea, a corneal ulcer can result. This is serious because it can cause corneal scarring. Corneal scars interfere with light passing through the cornea  and cause a loss of vision in the involved eye.  HOME CARE INSTRUCTIONS   Use medicine or ointment as directed. Only take over-the-counter or prescription medicines for pain, discomfort, or fever as directed by your health care provider.   Do not drive or operate machinery while your eye is patched. Your ability to judge distances is impaired.   If your health care provider has given you a follow-up appointment, it is very important to keep that appointment. Not keeping the appointment could result in a severe eye infection or permanent loss of vision. If there is any problem keeping the appointment, let your health care provider know.  SEEK MEDICAL CARE IF:    You have pain, light sensitivity, and a scratchy feeling in one eye or both eyes.   Your pressure patch keeps loosening up, and you can blink your eye under the patch after treatment.   Any kind of discharge develops from the eye after treatment or if the lids stick together in the morning.   You have the same symptoms in the morning as you did with the original abrasion days, weeks, or months after the abrasion healed.  MAKE SURE YOU:    Understand these instructions.   Will watch your condition.   Will get help right away if you are not doing well or get worse.  Document Released: 11/26/2000 Document Revised: 09/19/2013 Document Reviewed: 08/06/2013  [employer] Patient Information   2014 [employer].

## 2014-05-13 NOTE — Progress Notes (Signed)
Pre visit review using our clinic review tool, if applicable. No additional management support is needed unless otherwise documented below in the visit note. 

## 2014-05-13 NOTE — Telephone Encounter (Signed)
Relevant patient education mailed to patient.  

## 2015-08-12 ENCOUNTER — Encounter: Payer: Self-pay | Admitting: Family Medicine

## 2015-08-12 ENCOUNTER — Ambulatory Visit (INDEPENDENT_AMBULATORY_CARE_PROVIDER_SITE_OTHER): Payer: BLUE CROSS/BLUE SHIELD | Admitting: Family Medicine

## 2015-08-12 VITALS — BP 100/60 | HR 88 | Temp 99.0°F | Ht 63.0 in | Wt 124.8 lb

## 2015-08-12 DIAGNOSIS — F988 Other specified behavioral and emotional disorders with onset usually occurring in childhood and adolescence: Secondary | ICD-10-CM

## 2015-08-12 DIAGNOSIS — F909 Attention-deficit hyperactivity disorder, unspecified type: Secondary | ICD-10-CM | POA: Diagnosis not present

## 2015-08-12 MED ORDER — AMPHETAMINE-DEXTROAMPHET ER 15 MG PO CP24: 15.0000 mg | ORAL_CAPSULE | ORAL | Status: DC

## 2015-08-12 NOTE — Progress Notes (Signed)
Patient ID: Nicole Drake, female    DOB: Feb 06, 1976  Age: 39 y.o. MRN: [id number]    Subjective:  Subjective HPI Nicole Drake presents c/o trouble focusing since being back in school.  She was dx with ADD in college and stopped adderall after graduating from college.  She is struggling getting tasks done and is procastinating.    Review of Systems  Constitutional: Negative for diaphoresis, appetite change, fatigue and unexpected weight change.  Eyes: Negative for pain, redness and visual disturbance.  Respiratory: Negative for cough, chest tightness, shortness of breath and wheezing.   Cardiovascular: Negative for chest pain, palpitations and leg swelling.  Endocrine: Negative for cold intolerance, heat intolerance, polydipsia, polyphagia and polyuria.  Genitourinary: Negative for dysuria, frequency and difficulty urinating.  Neurological: Negative for dizziness, light-headedness, numbness and headaches.  Psychiatric/Behavioral: Positive for decreased concentration. Negative for dysphoric mood and agitation. The patient is not nervous/anxious.     History History reviewed. No pertinent past medical history.  She has past surgical history that includes Tonsillectomy; Ovarian cyst removal; and Cervix surgery.   Her family history includes Heart disease (age of onset: 46) in her paternal grandfather; Heart disease (age of onset: 20) in her father; Heart disease (age of onset: 51) in her maternal grandfather.She reports that she has been smoking Cigarettes.  She has a 4.5 pack-year smoking history. She has never used smokeless tobacco. She reports that she drinks alcohol. She reports that she does not use illicit drugs.  No current outpatient prescriptions on file prior to visit.   No current facility-administered medications on file prior to visit.     Objective:  Objective Physical Exam  Constitutional: She is oriented to person, place, and time. She appears well-developed and  well-nourished. No distress.  HENT:  Head: Normocephalic and atraumatic.  Mouth/Throat: Uvula is midline and mucous membranes are normal.  TMs WNL No TTP over sinuses Minimal nasal congestion  Eyes: Conjunctivae and EOM are normal. Pupils are equal, round, and reactive to light.  2-3 beats of horizontal nystagmus  Neck: Normal range of motion. Neck supple. No JVD present. Carotid bruit is not present. No thyromegaly present.  Cardiovascular: Normal rate, regular rhythm, normal heart sounds and intact distal pulses.   No murmur heard. Pulmonary/Chest: Effort normal and breath sounds normal. No respiratory distress. She has no wheezes. She has no rales. She exhibits no tenderness.  Musculoskeletal: She exhibits no edema.  Lymphadenopathy:    She has no cervical adenopathy.  Neurological: She is alert and oriented to person, place, and time. She has normal reflexes. No cranial nerve deficit.  Skin: Skin is warm and dry.  Psychiatric: She has a normal mood and affect. Her behavior is normal. Judgment and thought content normal.  Vitals reviewed. ADult adhd self report scale---  + ADD BP 100/60 mmHg  Pulse 88  Temp(Src) 99 F (37.2 C) (Oral)  Ht 5\' 3"  (1.6 m)  Wt 124 lb 12.8 oz (56.609 kg)  BMI 22.11 kg/m2  SpO2 97%  LMP 08/12/2015 Wt Readings from Last 3 Encounters:  08/12/15 124 lb 12.8 oz (56.609 kg)  05/13/14 125 lb (56.7 kg)  11/29/13 125 lb (56.7 kg)     Lab Results  Component Value Date   WBC 4.2* 11/29/2013   HGB 13.1 11/29/2013   HCT 37.9 11/29/2013   PLT 242.0 11/29/2013   GLUCOSE 79 11/29/2013   CHOL 134 11/29/2013   TRIG 53.0 11/29/2013   HDL 57.30 11/29/2013   LDLCALC 66  11/29/2013   ALT 15 11/29/2013   AST 20 11/29/2013   NA 139 11/29/2013   K 3.7 11/29/2013   CL 106 11/29/2013   CREATININE 0.7 11/29/2013   BUN 13 11/29/2013   CO2 26 11/29/2013   TSH 1.65 11/29/2013    No results found.   Assessment & Plan:  Plan I have discontinued Ms. Drake's  moxifloxacin. I am also having her start on amphetamine-dextroamphetamine.  Meds ordered this encounter  Medications  . amphetamine-dextroamphetamine (ADDERALL XR) 15 MG 24 hr capsule    Sig: Take 1 capsule by mouth every morning.    Dispense:  30 capsule    Refill:  0    Problem List Items Addressed This Visit    None    Visit Diagnoses    ADD (attention deficit disorder)    -  Primary    Relevant Medications    amphetamine-dextroamphetamine (ADDERALL XR) 15 MG 24 hr capsule       Follow-up: Return in about 4 weeks (around 09/09/2015), or if symptoms worsen or fail to improve, for add.  Garnet Koyanagi, DO

## 2015-08-12 NOTE — Progress Notes (Signed)
Pre visit review using our clinic review tool, if applicable. No additional management support is needed unless otherwise documented below in the visit note. 

## 2015-08-12 NOTE — Patient Instructions (Signed)

## 2015-09-09 ENCOUNTER — Ambulatory Visit: Payer: BLUE CROSS/BLUE SHIELD | Admitting: Family Medicine

## 2015-09-10 ENCOUNTER — Telehealth: Payer: Self-pay | Admitting: Family Medicine

## 2015-09-10 DIAGNOSIS — F988 Other specified behavioral and emotional disorders with onset usually occurring in childhood and adolescence: Secondary | ICD-10-CM

## 2015-09-10 MED ORDER — AMPHETAMINE-DEXTROAMPHET ER 15 MG PO CP24: 15.0000 mg | ORAL_CAPSULE | ORAL | Status: DC

## 2015-09-10 NOTE — Telephone Encounter (Signed)
Work # [phone] x [phone] (for today 09/10/15) Cell # won't be active again until 09/11/15 - pt phone was lost/stolen  Pt called due to missed appt 09/09/15. She said that she was out shopping and lost/misplace/stolen phone. She was retracing her steps and lost track of time and missed appt. Pt very apologetic. Rescheduled for 09/15/15. Pt states that she has 2 doses left of adderall 15mg  and she feels like maybe it isn't strong enough. She is asking for enough meds to get to Monday. She would like to try the 20mg  but if not comfortable with that until she is seen can she get enough 15mg  til her appt. Please call today at work # above.  Charge for no show?

## 2015-09-10 NOTE — Telephone Encounter (Signed)
Spoke with pt and she voices understanding. Rx forwarded to provider for signature.

## 2015-09-10 NOTE — Telephone Encounter (Signed)
No charge--- ok to give 1 month 20 mg

## 2015-09-11 NOTE — Telephone Encounter (Signed)
Fwd to [employer]

## 2015-09-11 NOTE — Telephone Encounter (Signed)
Patient had to reschedule for 10.3.16. Requesting for a few pills until seen. Please advise

## 2015-09-15 ENCOUNTER — Ambulatory Visit (INDEPENDENT_AMBULATORY_CARE_PROVIDER_SITE_OTHER): Payer: BLUE CROSS/BLUE SHIELD | Admitting: Family Medicine

## 2015-09-15 ENCOUNTER — Encounter: Payer: Self-pay | Admitting: Family Medicine

## 2015-09-15 VITALS — BP 100/60 | HR 95 | Temp 98.1°F | Ht 63.0 in | Wt 119.6 lb

## 2015-09-15 DIAGNOSIS — F909 Attention-deficit hyperactivity disorder, unspecified type: Secondary | ICD-10-CM

## 2015-09-15 DIAGNOSIS — F988 Other specified behavioral and emotional disorders with onset usually occurring in childhood and adolescence: Secondary | ICD-10-CM

## 2015-09-15 MED ORDER — AMPHETAMINE-DEXTROAMPHET ER 20 MG PO CP24: 20.0000 mg | ORAL_CAPSULE | ORAL | Status: DC

## 2015-09-15 NOTE — Progress Notes (Signed)
Patient ID: Nicole Drake, female   DOB: October 28, 1976, 39 y.o.   MRN: [id number]   Subjective:    Patient ID: Nicole Drake, female    DOB: [birth year], 39 y.o.   MRN: [id number]  Chief Complaint  Patient presents with  . ADD    follow up--Wants to discuss dosage    HPI Patient is in today for f/u add.  The adderall is working well but it is not lasting long enough.     No past medical history on file.  Past Surgical History  Procedure Laterality Date  . Tonsillectomy      age 34  . Ovarian cyst removal      age 27, 49, 27  . Cervix surgery      partial remove/age 1    Family History  Problem Relation Age of Onset  . Heart disease Father 25    MI  . Heart disease Maternal Grandfather 80    MI  . Heart disease Paternal Grandfather 2    MI    Social History   Social History  . Marital Status: Divorced    Spouse Name: N/A  . Number of Children: N/A  . Years of Education: N/A   Occupational History  . Not on file.   Social History Main Topics  . Smoking status: Current Every Day Smoker -- 0.25 packs/day for 18 years    Types: Cigarettes  . Smokeless tobacco: Never Used  . Alcohol Use: Yes     Comment: OCC  . Drug Use: No  . Sexual Activity: Not on file   Other Topics Concern  . Not on file   Social History Narrative    Outpatient Prescriptions Prior to Visit  Medication Sig Dispense Refill  . amphetamine-dextroamphetamine (ADDERALL XR) 15 MG 24 hr capsule Take 1 capsule by mouth every morning. 30 capsule 0   No facility-administered medications prior to visit.    Allergies  Allergen Reactions  . Penicillins Rash    Review of Systems  Constitutional: Negative for fever and malaise/fatigue.  HENT: Negative for congestion.   Eyes: Negative for discharge.  Respiratory: Negative for shortness of breath.   Cardiovascular: Negative for chest pain, palpitations and leg swelling.  Gastrointestinal: Negative for nausea and abdominal pain.  Genitourinary:  Negative for dysuria.  Musculoskeletal: Negative for falls.  Skin: Negative for rash.  Neurological: Negative for loss of consciousness and headaches.  Endo/Heme/Allergies: Negative for environmental allergies.  Psychiatric/Behavioral: Negative for depression. The patient is not nervous/anxious.        Objective:    Physical Exam  Constitutional: She is oriented to person, place, and time. She appears well-developed and well-nourished.  HENT:  Head: Normocephalic and atraumatic.  Eyes: Conjunctivae and EOM are normal.  Neck: Normal range of motion. Neck supple. No JVD present. Carotid bruit is not present. No thyromegaly present.  Cardiovascular: Normal rate, regular rhythm and normal heart sounds.   No murmur heard. Pulmonary/Chest: Effort normal and breath sounds normal. No respiratory distress. She has no wheezes. She has no rales. She exhibits no tenderness.  Musculoskeletal: She exhibits no edema.  Neurological: She is alert and oriented to person, place, and time.  Psychiatric: She has a normal mood and affect.  Nursing note and vitals reviewed.   BP 100/60 mmHg  Pulse 95  Temp(Src) 98.1 F (36.7 C) (Oral)  Ht 5\' 3"  (1.6 m)  Wt 119 lb 9.6 oz (54.25 kg)  BMI 21.19 kg/m2  SpO2 95%  LMP  09/08/2015 Wt Readings from Last 3 Encounters:  09/15/15 119 lb 9.6 oz (54.25 kg)  08/12/15 124 lb 12.8 oz (56.609 kg)  05/13/14 125 lb (56.7 kg)     Lab Results  Component Value Date   WBC 4.2* 11/29/2013   HGB 13.1 11/29/2013   HCT 37.9 11/29/2013   PLT 242.0 11/29/2013   GLUCOSE 79 11/29/2013   CHOL 134 11/29/2013   TRIG 53.0 11/29/2013   HDL 57.30 11/29/2013   LDLCALC 66 11/29/2013   ALT 15 11/29/2013   AST 20 11/29/2013   NA 139 11/29/2013   K 3.7 11/29/2013   CL 106 11/29/2013   CREATININE 0.7 11/29/2013   BUN 13 11/29/2013   CO2 26 11/29/2013   TSH 1.65 11/29/2013    Lab Results  Component Value Date   TSH 1.65 11/29/2013   Lab Results  Component Value  Date   WBC 4.2* 11/29/2013   HGB 13.1 11/29/2013   HCT 37.9 11/29/2013   MCV 94.5 11/29/2013   PLT 242.0 11/29/2013   Lab Results  Component Value Date   NA 139 11/29/2013   K 3.7 11/29/2013   CO2 26 11/29/2013   GLUCOSE 79 11/29/2013   BUN 13 11/29/2013   CREATININE 0.7 11/29/2013   BILITOT 0.6 11/29/2013   ALKPHOS 37* 11/29/2013   AST 20 11/29/2013   ALT 15 11/29/2013   PROT 7.5 11/29/2013   ALBUMIN 4.5 11/29/2013   CALCIUM 9.1 11/29/2013   GFR 108.02 11/29/2013   Lab Results  Component Value Date   CHOL 134 11/29/2013   Lab Results  Component Value Date   HDL 57.30 11/29/2013   Lab Results  Component Value Date   LDLCALC 66 11/29/2013   Lab Results  Component Value Date   TRIG 53.0 11/29/2013   Lab Results  Component Value Date   CHOLHDL 2 11/29/2013   No results found for: HGBA1C     Assessment & Plan:   Problem List Items Addressed This Visit    ADD (attention deficit disorder) - Primary      I have discontinued Ms. Drake's amphetamine-dextroamphetamine. I am also having her start on amphetamine-dextroamphetamine, amphetamine-dextroamphetamine, and amphetamine-dextroamphetamine.  Meds ordered this encounter  Medications  . amphetamine-dextroamphetamine (ADDERALL XR) 20 MG 24 hr capsule    Sig: Take 1 capsule (20 mg total) by mouth every morning.    Dispense:  30 capsule    Refill:  0    October '16  . amphetamine-dextroamphetamine (ADDERALL XR) 20 MG 24 hr capsule    Sig: Take 1 capsule (20 mg total) by mouth every morning. November '16    Dispense:  30 capsule    Refill:  0  . amphetamine-dextroamphetamine (ADDERALL XR) 20 MG 24 hr capsule    Sig: Take 1 capsule (20 mg total) by mouth every morning. December '16    Dispense:  30 capsule    Refill:  0   rto 6 months or sooner prn  Garnet Koyanagi, DO

## 2015-09-15 NOTE — Patient Instructions (Signed)

## 2015-09-15 NOTE — Progress Notes (Signed)
Pre visit review using our clinic review tool, if applicable. No additional management support is needed unless otherwise documented below in the visit note. 

## 2015-09-16 DIAGNOSIS — F988 Other specified behavioral and emotional disorders with onset usually occurring in childhood and adolescence: Secondary | ICD-10-CM | POA: Insufficient documentation

## 2016-01-06 ENCOUNTER — Telehealth: Payer: Self-pay | Admitting: Family Medicine

## 2016-01-06 ENCOUNTER — Encounter: Payer: Self-pay | Admitting: Family Medicine

## 2016-01-06 MED ORDER — AMPHETAMINE-DEXTROAMPHET ER 20 MG PO CP24: 20.0000 mg | ORAL_CAPSULE | ORAL | Status: DC

## 2016-01-06 NOTE — Telephone Encounter (Signed)
Caller name: Self  Can be reached: [phone]   Reason for call: Request refill on (ADDERALL XR) 20 MG 24 hr capsule [id number]  Patient has appointment 2/3 but will run out of medication 1/30.

## 2016-01-06 NOTE — Telephone Encounter (Signed)
Patient aware Rx will be ready for pick up after 3 pm.     KP

## 2016-01-16 ENCOUNTER — Ambulatory Visit: Payer: BLUE CROSS/BLUE SHIELD | Admitting: Family Medicine

## 2016-01-19 ENCOUNTER — Telehealth: Payer: Self-pay | Admitting: Family Medicine

## 2016-01-19 ENCOUNTER — Encounter: Payer: Self-pay | Admitting: Family Medicine

## 2016-01-19 NOTE — Telephone Encounter (Signed)
charge 

## 2016-01-19 NOTE — Telephone Encounter (Signed)
Marked to charge, mailing no show letter °

## 2016-01-19 NOTE — Telephone Encounter (Signed)
Pt was no show 01/16/16 1:15pm for follow up appt, pt has not rescheduled, 2nd no show, charge or no charge?

## 2016-02-05 ENCOUNTER — Telehealth: Payer: Self-pay

## 2016-02-05 NOTE — Telephone Encounter (Signed)
UDS reported on 01/15/16 was Negative for Adderall despite the patient getting the Rx every 3 months. Patient also No Showed her apt, Dr.Lowne has documented mo more Adderall from this office.    KP

## 2016-02-19 ENCOUNTER — Encounter: Payer: Self-pay | Admitting: Family Medicine

## 2016-02-25 ENCOUNTER — Encounter: Payer: Self-pay | Admitting: Medical

## 2016-02-25 ENCOUNTER — Ambulatory Visit (INDEPENDENT_AMBULATORY_CARE_PROVIDER_SITE_OTHER): Payer: BLUE CROSS/BLUE SHIELD | Admitting: Medical

## 2016-02-25 VITALS — BP 92/70 | HR 92 | Temp 99.5°F | Ht 63.0 in | Wt 118.6 lb

## 2016-02-25 DIAGNOSIS — J029 Acute pharyngitis, unspecified: Secondary | ICD-10-CM

## 2016-02-25 DIAGNOSIS — R05 Cough: Secondary | ICD-10-CM | POA: Diagnosis not present

## 2016-02-25 DIAGNOSIS — R059 Cough, unspecified: Secondary | ICD-10-CM

## 2016-02-25 DIAGNOSIS — M791 Myalgia: Secondary | ICD-10-CM

## 2016-02-25 DIAGNOSIS — M609 Myositis, unspecified: Secondary | ICD-10-CM | POA: Diagnosis not present

## 2016-02-25 DIAGNOSIS — IMO0001 Reserved for inherently not codable concepts without codable children: Secondary | ICD-10-CM

## 2016-02-25 LAB — POCT RAPID STREP A (OFFICE): Rapid Strep A Screen: NEGATIVE

## 2016-02-25 MED ORDER — AZITHROMYCIN 250 MG PO TABS: ORAL_TABLET | ORAL | Status: DC

## 2016-02-25 MED ORDER — OSELTAMIVIR PHOSPHATE 75 MG PO CAPS: 75.0000 mg | ORAL_CAPSULE | Freq: Two times a day (BID) | ORAL | Status: DC

## 2016-02-25 MED ORDER — HYDROCODONE-HOMATROPINE 5-1.5 MG/5ML PO SYRP: 5.0000 mL | ORAL_SOLUTION | Freq: Three times a day (TID) | ORAL | Status: DC | PRN

## 2016-02-25 NOTE — Patient Instructions (Addendum)
Your throat looks suspicious for strep and level of a pain is suspicious as well.  Will rx azithromycin to cover strep and also to cover potential bronchitis vs pneumonia.  For cough will rx hycodan.  If you chest congestion worsens despite the above measures then get chest xray. I have placed order.  Your flu test was negative.(start antibiotic today. In event you feel worse despite the above measures by tomorrow go ahead and start tamiflu).  Follow up 7 days or as needed.

## 2016-02-25 NOTE — Progress Notes (Signed)
Pre visit review using our clinic review tool, if applicable. No additional management support is needed unless otherwise documented below in the visit note. 

## 2016-02-25 NOTE — Progress Notes (Signed)
Subjective:    Patient ID: Nicole Drake, female    DOB: 04-03-1976, 40 y.o.   MRN: [id number]  HPI  Pt in with some chest congestion. Pt feel like needs to bring up mucous and states can feel rattle sensation. When she takes a deep breath will cough a lot. Pt has had fevers, chills and sweats. This has been going on for 2 days. Pt has bodyaches. Head to toe achiness. No wheezing. Pt also had had severe st x 2 days.   LMP- started this Monday.  Some cramping of calf and feet last night. But not now. Now pain in popliteal areas. No sob.  Pt is a smoker.    Review of Systems  Constitutional: Positive for fever, chills and fatigue.  HENT: Positive for sore throat. Negative for congestion, postnasal drip, rhinorrhea and sinus pressure.        Severe sore throat.  Respiratory: Positive for cough. Negative for shortness of breath and wheezing.   Gastrointestinal: Negative for abdominal pain.  Musculoskeletal: Positive for myalgias.  Skin: Negative for rash.  Neurological: Negative for dizziness and headaches.  Hematological: Negative for adenopathy. Does not bruise/bleed easily.  Psychiatric/Behavioral: Negative for confusion.    No past medical history on file.  Social History   Social History  . Marital Status: Divorced    Spouse Name: N/A  . Number of Children: N/A  . Years of Education: N/A   Occupational History  . Not on file.   Social History Main Topics  . Smoking status: Current Every Day Smoker -- 0.25 packs/day for 18 years    Types: Cigarettes  . Smokeless tobacco: Never Used  . Alcohol Use: Yes     Comment: OCC  . Drug Use: No  . Sexual Activity: Not on file   Other Topics Concern  . Not on file   Social History Narrative    Past Surgical History  Procedure Laterality Date  . Tonsillectomy      age 29  . Ovarian cyst removal      age 4, 71, 18  . Cervix surgery      partial remove/age 62    Family History  Problem Relation Age of Onset  .  Heart disease Father 4    MI  . Heart disease Maternal Grandfather 80    MI  . Heart disease Paternal Grandfather 61    MI    Allergies  Allergen Reactions  . Penicillins Rash    Current Outpatient Prescriptions on File Prior to Visit  Medication Sig Dispense Refill  . amphetamine-dextroamphetamine (ADDERALL XR) 20 MG 24 hr capsule Take 1 capsule (20 mg total) by mouth every morning. 30 capsule 0   No current facility-administered medications on file prior to visit.    BP 92/70 mmHg  Pulse 127  Temp(Src) 99.5 F (37.5 C) (Oral)  Ht 5\' 3"  (1.6 m)  Wt 118 lb 9.6 oz (53.797 kg)  BMI 21.01 kg/m2  SpO2 98%  LMP 02/25/2016  I checked and pt pulse was 92. I left monitor on finger extended time to double check.     Objective:   Physical Exam  General  Mental Status - Alert. General Appearance - Well groomed. Not in acute distress.  Skin Rashes- No Rashes.  HEENT Head- Normal. Ear Auditory Canal - Left- Normal. Right - Normal.Tympanic Membrane- Left- Normal. Right- Normal. Eye Sclera/Conjunctiva- Left- Normal. Right- Normal. Nose & Sinuses Nasal Mucosa- Left-  Boggy and Congested. Right-  Boggy  and  Congested.Bilateral maxillary and frontal sinus pressure. Mouth & Throat Lips: Upper Lip- Normal: no dryness, cracking, pallor, cyanosis, or vesicular eruption. Lower Lip-Normal: no dryness, cracking, pallor, cyanosis or vesicular eruption. Buccal Mucosa- Bilateral- No Aphthous ulcers. Oropharynx- No Discharge or Erythema. Tonsils: Characteristics- Bilateral- moderate Erythema and  Congestion. Size/Enlargement- Bilateral- No enlargement. Discharge- bilateral-None.  Neck Neck- Supple. No Masses.   Chest and Lung Exam Auscultation: Breath Sounds:- even and unlabored. But when deep breaths will get cough. Faint rough breath sound left lower lobe.  Cardiovascular Auscultation:Rythm- Regular, rate and rhythm. Murmurs & Other Heart Sounds:Ausculatation of the heart  reveal- No Murmurs.  Lymphatic Head & Neck General Head & Neck Lymphatics: Bilateral: Description- No Localized lymphadenopathy.  Lower ext- no pedal edema. Negative homans signs.      Assessment & Plan:  Your throat looks suspicious for strep and level of a pain is suspicious as well. Rapid test negative.  Will rx azithromycin to cover strep and also to cover potential bronchitis vs pneumonia.  For cough will rx hycodan.  If you chest congestion worsens despite the above measures then get chest xray. I have placed order.  Your flu test was negative.(start antibiotic today. In event you feel worse despite the above measures by tomorrow go ahead and start tamiflu).  Follow up 7 days or as needed.

## 2016-04-09 ENCOUNTER — Telehealth: Payer: Self-pay | Admitting: Family Medicine

## 2016-04-09 NOTE — Telephone Encounter (Signed)
Relation to [id number] Call back number:[phone]   Reason for call:  Patient requesting a refill amphetamine-dextroamphetamine (ADDERALL XR) 20 MG 24 hr capsule

## 2016-04-09 NOTE — Telephone Encounter (Signed)
Last filled: 01/06/16---note: Do not fill until March 2017 Amt: 30, 0 01/13/16 controlled substance contract signed, uds sample given [facility] prescription per doctor Lowne.   Called patient using number listed below.  No answer. Left a message for call back.

## 2016-04-12 ENCOUNTER — Telehealth: Payer: Self-pay

## 2016-04-12 NOTE — Telephone Encounter (Signed)
Patient aware that she will not get anymore refills due to Neg UDS and NO show, she wanted to schedule an appointment to discuss. Apt scheduled for 04/14/15 at 4 pm.      KP

## 2016-04-12 NOTE — Telephone Encounter (Signed)
error 

## 2016-04-13 ENCOUNTER — Ambulatory Visit (INDEPENDENT_AMBULATORY_CARE_PROVIDER_SITE_OTHER): Payer: BLUE CROSS/BLUE SHIELD | Admitting: Family Medicine

## 2016-04-13 ENCOUNTER — Other Ambulatory Visit: Payer: Self-pay | Admitting: Family Medicine

## 2016-04-13 ENCOUNTER — Encounter: Payer: Self-pay | Admitting: Family Medicine

## 2016-04-13 VITALS — BP 111/70 | HR 94 | Temp 98.1°F | Wt 112.0 lb

## 2016-04-13 DIAGNOSIS — Z1231 Encounter for screening mammogram for malignant neoplasm of breast: Secondary | ICD-10-CM

## 2016-04-13 DIAGNOSIS — F988 Other specified behavioral and emotional disorders with onset usually occurring in childhood and adolescence: Secondary | ICD-10-CM

## 2016-04-13 DIAGNOSIS — F909 Attention-deficit hyperactivity disorder, unspecified type: Secondary | ICD-10-CM

## 2016-04-13 MED ORDER — AMPHETAMINE-DEXTROAMPHET ER 20 MG PO CP24: 20.0000 mg | ORAL_CAPSULE | ORAL | Status: DC

## 2016-04-13 NOTE — Patient Instructions (Signed)

## 2016-04-13 NOTE — Progress Notes (Signed)
Pre visit review using our clinic review tool, if applicable. No additional management support is needed unless otherwise documented below in the visit note. 

## 2016-04-13 NOTE — Progress Notes (Signed)
Patient ID: Nicole Drake, female    DOB: [birth year]  Age: 40 y.o. MRN: [id number]    Subjective:  Subjective HPI Nicole Drake presents for f/u ADD.   meds are working well.  No complaints.   Review of Systems  Constitutional: Negative for diaphoresis, appetite change, fatigue and unexpected weight change.  Eyes: Negative for pain, redness and visual disturbance.  Respiratory: Negative for cough, chest tightness, shortness of breath and wheezing.   Cardiovascular: Negative for chest pain, palpitations and leg swelling.  Endocrine: Negative for cold intolerance, heat intolerance, polydipsia, polyphagia and polyuria.  Genitourinary: Negative for dysuria, frequency and difficulty urinating.  Neurological: Negative for dizziness, light-headedness, numbness and headaches.    History No past medical history on file.  She has past surgical history that includes Tonsillectomy; Ovarian cyst removal; and Cervix surgery.   Her family history includes Heart disease (age of onset: 4) in her paternal grandfather; Heart disease (age of onset: 87) in her father; Heart disease (age of onset: 37) in her maternal grandfather.She reports that she has been smoking Cigarettes.  She has a 4.5 pack-year smoking history. She has never used smokeless tobacco. She reports that she drinks alcohol. She reports that she does not use illicit drugs.  Current Outpatient Prescriptions on File Prior to Visit  Medication Sig Dispense Refill  . amphetamine-dextroamphetamine (ADDERALL XR) 20 MG 24 hr capsule Take 1 capsule (20 mg total) by mouth every morning. 30 capsule 0   No current facility-administered medications on file prior to visit.     Objective:  Objective Physical Exam  Constitutional: She is oriented to person, place, and time. She appears well-developed and well-nourished.  HENT:  Head: Normocephalic and atraumatic.  Eyes: Conjunctivae and EOM are normal.  Neck: Normal range of motion. Neck supple.  No JVD present. Carotid bruit is not present. No thyromegaly present.  Cardiovascular: Normal rate, regular rhythm and normal heart sounds.   No murmur heard. Pulmonary/Chest: Effort normal and breath sounds normal. No respiratory distress. She has no wheezes. She has no rales. She exhibits no tenderness.  Musculoskeletal: She exhibits no edema.  Neurological: She is alert and oriented to person, place, and time.  Psychiatric: She has a normal mood and affect. Her behavior is normal.  Nursing note and vitals reviewed.  BP 111/70 mmHg  Pulse 94  Temp(Src) 98.1 F (36.7 C) (Oral)  Wt 112 lb (50.803 kg)  SpO2 100%  LMP 04/04/2016 Wt Readings from Last 3 Encounters:  04/13/16 112 lb (50.803 kg)  02/25/16 118 lb 9.6 oz (53.797 kg)  09/15/15 119 lb 9.6 oz (54.25 kg)     Lab Results  Component Value Date   WBC 4.2* 11/29/2013   HGB 13.1 11/29/2013   HCT 37.9 11/29/2013   PLT 242.0 11/29/2013   GLUCOSE 79 11/29/2013   CHOL 134 11/29/2013   TRIG 53.0 11/29/2013   HDL 57.30 11/29/2013   LDLCALC 66 11/29/2013   ALT 15 11/29/2013   AST 20 11/29/2013   NA 139 11/29/2013   K 3.7 11/29/2013   CL 106 11/29/2013   CREATININE 0.7 11/29/2013   BUN 13 11/29/2013   CO2 26 11/29/2013   TSH 1.65 11/29/2013    No results found.   Assessment & Plan:  Plan I have discontinued Ms. Drake's azithromycin, HYDROcodone-homatropine, and oseltamivir. I am also having her start on amphetamine-dextroamphetamine, amphetamine-dextroamphetamine, and amphetamine-dextroamphetamine. Additionally, I am having her maintain her amphetamine-dextroamphetamine.  Meds ordered this encounter  Medications  . amphetamine-dextroamphetamine (  ADDERALL XR) 20 MG 24 hr capsule    Sig: Take 1 capsule (20 mg total) by mouth every morning.    Dispense:  30 capsule    Refill:  0  . amphetamine-dextroamphetamine (ADDERALL XR) 20 MG 24 hr capsule    Sig: Take 1 capsule (20 mg total) by mouth every morning.    Dispense:   30 capsule    Refill:  0    Do not fill until 05/16/2016  . amphetamine-dextroamphetamine (ADDERALL XR) 20 MG 24 hr capsule    Sig: Take 1 capsule (20 mg total) by mouth every morning.    Dispense:  30 capsule    Refill:  0    Do not fill until 06/14/2016    Problem List Items Addressed This Visit    None    Visit Diagnoses    Attention deficit disorder    -  Primary    Relevant Medications    amphetamine-dextroamphetamine (ADDERALL XR) 20 MG 24 hr capsule    amphetamine-dextroamphetamine (ADDERALL XR) 20 MG 24 hr capsule    amphetamine-dextroamphetamine (ADDERALL XR) 20 MG 24 hr capsule       Follow-up: Return in about 6 months (around 10/14/2016), or if symptoms worsen or fail to improve, for add.  Ann Held, DO

## 2016-04-19 ENCOUNTER — Ambulatory Visit (HOSPITAL_BASED_OUTPATIENT_CLINIC_OR_DEPARTMENT_OTHER): Payer: BLUE CROSS/BLUE SHIELD

## 2016-04-20 ENCOUNTER — Ambulatory Visit (HOSPITAL_BASED_OUTPATIENT_CLINIC_OR_DEPARTMENT_OTHER): Payer: BLUE CROSS/BLUE SHIELD

## 2016-04-26 ENCOUNTER — Ambulatory Visit (HOSPITAL_BASED_OUTPATIENT_CLINIC_OR_DEPARTMENT_OTHER)
Admission: RE | Admit: 2016-04-26 | Discharge: 2016-04-26 | Disposition: A | Discharge status: Home / Self Care | Payer: BLUE CROSS/BLUE SHIELD | Source: Ambulatory Visit | Attending: Family Medicine | Admitting: Family Medicine

## 2016-04-26 DIAGNOSIS — Z1231 Encounter for screening mammogram for malignant neoplasm of breast: Secondary | ICD-10-CM | POA: Diagnosis not present

## 2016-04-26 DIAGNOSIS — R928 Other abnormal and inconclusive findings on diagnostic imaging of breast: Secondary | ICD-10-CM | POA: Insufficient documentation

## 2016-04-28 ENCOUNTER — Other Ambulatory Visit: Payer: Self-pay | Admitting: Family Medicine

## 2016-04-28 DIAGNOSIS — R928 Other abnormal and inconclusive findings on diagnostic imaging of breast: Secondary | ICD-10-CM

## 2016-05-03 ENCOUNTER — Telehealth: Payer: Self-pay

## 2016-05-03 NOTE — Telephone Encounter (Signed)
[facility] called needing provider to sign the order in epic in order for them to see patient tomorrow

## 2016-05-04 ENCOUNTER — Ambulatory Visit
Admission: RE | Admit: 2016-05-04 | Discharge: 2016-05-04 | Disposition: A | Discharge status: Home / Self Care | Payer: BLUE CROSS/BLUE SHIELD | Source: Ambulatory Visit | Attending: Family Medicine | Admitting: Family Medicine

## 2016-05-04 DIAGNOSIS — R928 Other abnormal and inconclusive findings on diagnostic imaging of breast: Secondary | ICD-10-CM

## 2016-06-17 ENCOUNTER — Ambulatory Visit (HOSPITAL_BASED_OUTPATIENT_CLINIC_OR_DEPARTMENT_OTHER)
Admission: RE | Admit: 2016-06-17 | Discharge: 2016-06-17 | Disposition: A | Discharge status: Home / Self Care | Payer: BLUE CROSS/BLUE SHIELD | Source: Ambulatory Visit | Attending: Medical | Admitting: Medical

## 2016-06-17 ENCOUNTER — Ambulatory Visit (INDEPENDENT_AMBULATORY_CARE_PROVIDER_SITE_OTHER): Payer: BLUE CROSS/BLUE SHIELD | Admitting: Medical

## 2016-06-17 ENCOUNTER — Encounter: Payer: Self-pay | Admitting: Medical

## 2016-06-17 VITALS — BP 110/70 | HR 77 | Temp 98.1°F | Ht 63.0 in | Wt 114.0 lb

## 2016-06-17 DIAGNOSIS — M25512 Pain in left shoulder: Secondary | ICD-10-CM

## 2016-06-17 DIAGNOSIS — M25571 Pain in right ankle and joints of right foot: Secondary | ICD-10-CM | POA: Diagnosis not present

## 2016-06-17 DIAGNOSIS — M79662 Pain in left lower leg: Secondary | ICD-10-CM | POA: Diagnosis not present

## 2016-06-17 DIAGNOSIS — M25522 Pain in left elbow: Secondary | ICD-10-CM

## 2016-06-17 DIAGNOSIS — M25551 Pain in right hip: Secondary | ICD-10-CM

## 2016-06-17 DIAGNOSIS — M79622 Pain in left upper arm: Secondary | ICD-10-CM

## 2016-06-17 DIAGNOSIS — M25532 Pain in left wrist: Secondary | ICD-10-CM | POA: Diagnosis not present

## 2016-06-17 MED ORDER — DICLOFENAC SODIUM 75 MG PO TBEC: 75.0000 mg | DELAYED_RELEASE_TABLET | Freq: Two times a day (BID) | ORAL | Status: DC

## 2016-06-17 MED ORDER — HYDROCODONE-ACETAMINOPHEN 5-325 MG PO TABS: 1.0000 | ORAL_TABLET | Freq: Four times a day (QID) | ORAL | Status: DC | PRN

## 2016-06-17 MED ORDER — CYCLOBENZAPRINE HCL 10 MG PO TABS: 10.0000 mg | ORAL_TABLET | Freq: Every day | ORAL | Status: DC

## 2016-06-17 NOTE — Progress Notes (Signed)
Pre visit review using our clinic review tool, if applicable. No additional management support is needed unless otherwise documented below in the visit note. 
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
## 2016-06-17 NOTE — Patient Instructions (Addendum)
For your areas of pain will get xrays.  For pain and inflammation will rx diclofenac. For break through pain rx norco  For muscle tightness or spasms rx flexeril.  If you arm pain increases or swells more then notify us. If over weekend then ED. I considered getting [place] of area but I don't think indicated. But if area were to worsen then ED evaluation over weekend. But do want to recheck area on Monday or Tuesday. Please make follow up.

## 2016-06-22 ENCOUNTER — Ambulatory Visit (INDEPENDENT_AMBULATORY_CARE_PROVIDER_SITE_OTHER): Payer: BLUE CROSS/BLUE SHIELD | Admitting: Medical

## 2016-06-22 ENCOUNTER — Ambulatory Visit (HOSPITAL_BASED_OUTPATIENT_CLINIC_OR_DEPARTMENT_OTHER)
Admission: RE | Admit: 2016-06-22 | Discharge: 2016-06-22 | Disposition: A | Discharge status: Home / Self Care | Payer: BLUE CROSS/BLUE SHIELD | Source: Ambulatory Visit | Attending: Medical | Admitting: Medical

## 2016-06-22 ENCOUNTER — Telehealth: Payer: Self-pay | Admitting: Medical

## 2016-06-22 ENCOUNTER — Encounter: Payer: Self-pay | Admitting: Medical

## 2016-06-22 VITALS — BP 108/64 | HR 85 | Temp 98.1°F | Ht 63.0 in | Wt 113.6 lb

## 2016-06-22 DIAGNOSIS — M79622 Pain in left upper arm: Secondary | ICD-10-CM | POA: Insufficient documentation

## 2016-06-22 DIAGNOSIS — M25522 Pain in left elbow: Secondary | ICD-10-CM

## 2016-06-22 MED ORDER — HYDROCODONE-ACETAMINOPHEN 5-325 MG PO TABS: 1.0000 | ORAL_TABLET | Freq: Four times a day (QID) | ORAL | Status: DC | PRN

## 2016-06-22 NOTE — Telephone Encounter (Signed)
Spoke with pt and advised her of the ultrasound appointment at 5:30pm. Pt voices understanding.

## 2016-06-22 NOTE — Progress Notes (Signed)
Subjective:    Patient ID: Nicole Drake, female    DOB: Aug 06, 1976, 40 y.o.   MRN: [id number]  HPI   Pt in for follow up. She still has pain to left humerus/ bicep area. Some swelling and the area feels mild tight. Describes severe pain that shoots down arm her forearm.   Pain would keep her up at night.  All of her xrays did not show fracture.   Review of Systems  Constitutional: Negative for fever, chills and fatigue.  Musculoskeletal:       Left upper ext pain.  Neurological: Negative for dizziness and headaches.  Hematological: Negative for adenopathy. Does not bruise/bleed easily.    No past medical history on file.   Social History   Social History  . Marital Status: Divorced    Spouse Name: N/A  . Number of Children: N/A  . Years of Education: N/A   Occupational History  . Not on file.   Social History Main Topics  . Smoking status: Current Every Day Smoker -- 0.25 packs/day for 18 years    Types: Cigarettes  . Smokeless tobacco: Never Used  . Alcohol Use: Yes     Comment: OCC  . Drug Use: No  . Sexual Activity: Not on file   Other Topics Concern  . Not on file   Social History Narrative    Past Surgical History  Procedure Laterality Date  . Tonsillectomy      age 24  . Ovarian cyst removal      age 27, 27, 81  . Cervix surgery      partial remove/age 68    Family History  Problem Relation Age of Onset  . Heart disease Father 66    MI  . Heart disease Maternal Grandfather 80    MI  . Heart disease Paternal Grandfather 38    MI    Allergies  Allergen Reactions  . Penicillins Rash    Current Outpatient Prescriptions on File Prior to Visit  Medication Sig Dispense Refill  . amphetamine-dextroamphetamine (ADDERALL XR) 20 MG 24 hr capsule Take 1 capsule (20 mg total) by mouth every morning. 30 capsule 0  . amphetamine-dextroamphetamine (ADDERALL XR) 20 MG 24 hr capsule Take 1 capsule (20 mg total) by mouth every morning. 30 capsule 0    . amphetamine-dextroamphetamine (ADDERALL XR) 20 MG 24 hr capsule Take 1 capsule (20 mg total) by mouth every morning. 30 capsule 0  . cyclobenzaprine (FLEXERIL) 10 MG tablet Take 1 tablet (10 mg total) by mouth at bedtime. 10 tablet 0  . diclofenac (VOLTAREN) 75 MG EC tablet Take 1 tablet (75 mg total) by mouth 2 (two) times daily. 30 tablet 0  . HYDROcodone-acetaminophen (NORCO) 5-325 MG tablet Take 1 tablet by mouth every 6 (six) hours as needed for moderate pain. 12 tablet 0   No current facility-administered medications on file prior to visit.    BP 108/64 mmHg  Pulse 85  Temp(Src) 98.1 F (36.7 C) (Oral)  Ht 5\' 3"  (1.6 m)  Wt 113 lb 9.6 oz (51.529 kg)  BMI 20.13 kg/m2  SpO2 99%  LMP 06/17/2016      Objective:   Physical Exam  General- No acute distress. Pleasant patient. Lungs- Clear, even and unlabored. Heart- regular rate and rhythm. Neurologic- CNII- XII grossly intact.  Lt upper ext- mild swollen and bruised. Pain on flexion.       Assessment & Plan:  Your left upper ext showed neg xrays. But  your arm is swollen, bruised and painful. I want to make sure no secondary injury to arm. Such as vessels injury or thrombosis. Will try to get you scheduled today or tommorow after 5 pm.  Continue current med regimen.  Will refill norco today.  Follow up in 7-10 days or as needed  Sevon Rotert, Percell Miller, [employer]

## 2016-06-22 NOTE — Telephone Encounter (Signed)
Pt [place] of left upper ext is today at 5:30 pm. Please notify pt of time. She can leave after test done but keep her phone on in event positive finding. Otherwise will notify her result tomorrow am.

## 2016-06-22 NOTE — Progress Notes (Signed)
Pre visit review using our clinic review tool, if applicable. No additional management support is needed unless otherwise documented below in the visit note. 

## 2016-06-22 NOTE — Patient Instructions (Signed)
Your left upper ext showed neg xrays. But your arm is swollen, bruised and painful. I want to make sure no secondary injury to arm. Such as vessels injury or thrombosis. Will try to get you scheduled today or tommorow after 5 pm.  Continue current med regimen.  Will refill norco today.  Follow up in 7-10 days or as needed

## 2016-07-20 ENCOUNTER — Telehealth: Payer: Self-pay | Admitting: Family Medicine

## 2016-07-20 ENCOUNTER — Other Ambulatory Visit: Payer: Self-pay | Admitting: Family Medicine

## 2016-07-20 DIAGNOSIS — F988 Other specified behavioral and emotional disorders with onset usually occurring in childhood and adolescence: Secondary | ICD-10-CM

## 2016-07-20 MED ORDER — AMPHETAMINE-DEXTROAMPHET ER 20 MG PO CP24: 20.0000 mg | ORAL_CAPSULE | ORAL | 0 refills | Status: DC

## 2016-07-20 NOTE — Telephone Encounter (Signed)
°  Relation to [id number] Call back [phone] Pharmacy: [id number] - [place], [place] [phone] (Phone) [phone] (Fax)     Reason for call:  Patient requesting a refill amphetamine-dextroamphetamine (ADDERALL XR) 20 MG 24 hr capsule

## 2016-07-20 NOTE — Telephone Encounter (Signed)
Duplicate request. Sent through my-chart.    KP

## 2016-08-30 ENCOUNTER — Ambulatory Visit (INDEPENDENT_AMBULATORY_CARE_PROVIDER_SITE_OTHER): Payer: Managed Care, Other (non HMO) | Admitting: Family Medicine

## 2016-08-30 ENCOUNTER — Encounter: Payer: Self-pay | Admitting: Family Medicine

## 2016-08-30 VITALS — BP 103/71 | HR 84 | Resp 16 | Ht 63.0 in | Wt 114.4 lb

## 2016-08-30 DIAGNOSIS — F988 Other specified behavioral and emotional disorders with onset usually occurring in childhood and adolescence: Secondary | ICD-10-CM

## 2016-08-30 DIAGNOSIS — J069 Acute upper respiratory infection, unspecified: Secondary | ICD-10-CM | POA: Diagnosis not present

## 2016-08-30 DIAGNOSIS — F909 Attention-deficit hyperactivity disorder, unspecified type: Secondary | ICD-10-CM

## 2016-08-30 MED ORDER — AMPHETAMINE-DEXTROAMPHET ER 20 MG PO CP24: 20.0000 mg | ORAL_CAPSULE | ORAL | 0 refills | Status: DC

## 2016-08-30 MED ORDER — FLUTICASONE PROPIONATE 50 MCG/ACT NA SUSP: 2.0000 | Freq: Every day | NASAL | 6 refills | Status: DC

## 2016-08-30 MED ORDER — LEVOCETIRIZINE DIHYDROCHLORIDE 5 MG PO TABS: 5.0000 mg | ORAL_TABLET | Freq: Every evening | ORAL | 5 refills | Status: DC

## 2016-08-30 NOTE — Assessment & Plan Note (Signed)
Stable con't meds 

## 2016-08-30 NOTE — Progress Notes (Signed)
Pre visit review using our clinic review tool, if applicable. No additional management support is needed unless otherwise documented below in the visit note. 

## 2016-08-30 NOTE — Patient Instructions (Signed)
Upper Respiratory Infection, Adult Most upper respiratory infections (URIs) are a viral infection of the air passages leading to the lungs. A URI affects the nose, throat, and upper air passages. The most common type of URI is nasopharyngitis and is typically referred to as "the common cold." URIs run their course and usually go away on their own. Most of the time, a URI does not require medical attention, but sometimes a bacterial infection in the upper airways can follow a viral infection. This is called a secondary infection. Sinus and middle ear infections are common types of secondary upper respiratory infections. Bacterial pneumonia can also complicate a URI. A URI can worsen asthma and chronic obstructive pulmonary disease (COPD). Sometimes, these complications can require emergency medical care and may be life threatening.  CAUSES Almost all URIs are caused by viruses. A virus is a type of germ and can spread from one person to another.  RISKS FACTORS You may be at risk for a URI if:   You smoke.   You have chronic heart or lung disease.  You have a weakened defense (immune) system.   You are very young or very old.   You have nasal allergies or asthma.  You work in crowded or poorly ventilated areas.  You work in health care facilities or schools. SIGNS AND SYMPTOMS  Symptoms typically develop 2-3 days after you come in contact with a cold virus. Most viral URIs last 7-10 days. However, viral URIs from the influenza virus (flu virus) can last 14-18 days and are typically more severe. Symptoms may include:   Runny or stuffy (congested) nose.   Sneezing.   Cough.   Sore throat.   Headache.   Fatigue.   Fever.   Loss of appetite.   Pain in your forehead, behind your eyes, and over your cheekbones (sinus pain).  Muscle aches.  DIAGNOSIS  Your health care provider may diagnose a URI by:  Physical exam.  Tests to check that your symptoms are not due to  another condition such as:  Strep throat.  Sinusitis.  Pneumonia.  Asthma. TREATMENT  A URI goes away on its own with time. It cannot be cured with medicines, but medicines may be prescribed or recommended to relieve symptoms. Medicines may help:  Reduce your fever.  Reduce your cough.  Relieve nasal congestion. HOME CARE INSTRUCTIONS   Take medicines only as directed by your health care provider.   Gargle warm saltwater or take cough drops to comfort your throat as directed by your health care provider.  Use a warm mist humidifier or inhale steam from a shower to increase air moisture. This may make it easier to breathe.  Drink enough fluid to keep your urine clear or pale yellow.   Eat soups and other clear broths and maintain good nutrition.   Rest as needed.   Return to work when your temperature has returned to normal or as your health care provider advises. You may need to stay home longer to avoid infecting others. You can also use a face mask and careful hand washing to prevent spread of the virus.  Increase the usage of your inhaler if you have asthma.   Do not use any tobacco products, including cigarettes, chewing tobacco, or electronic cigarettes. If you need help quitting, ask your health care provider. PREVENTION  The best way to protect yourself from getting a cold is to practice good hygiene.   Avoid oral or hand contact with people with cold   symptoms.   Wash your hands often if contact occurs.  There is no clear evidence that vitamin C, vitamin E, echinacea, or exercise reduces the chance of developing a cold. However, it is always recommended to get plenty of rest, exercise, and practice good nutrition.  SEEK MEDICAL CARE IF:   You are getting worse rather than better.   Your symptoms are not controlled by medicine.   You have chills.  You have worsening shortness of breath.  You have brown or red mucus.  You have yellow or brown nasal  discharge.  You have pain in your face, especially when you bend forward.  You have a fever.  You have swollen neck glands.  You have pain while swallowing.  You have white areas in the back of your throat. SEEK IMMEDIATE MEDICAL CARE IF:   You have severe or persistent:  Headache.  Ear pain.  Sinus pain.  Chest pain.  You have chronic lung disease and any of the following:  Wheezing.  Prolonged cough.  Coughing up blood.  A change in your usual mucus.  You have a stiff neck.  You have changes in your:  Vision.  Hearing.  Thinking.  Mood. MAKE SURE YOU:   Understand these instructions.  Will watch your condition.  Will get help right away if you are not doing well or get worse.   This information is not intended to replace advice given to you by your health care provider. Make sure you discuss any questions you have with your health care provider.   Document Released: 05/25/2001 Document Revised: 04/15/2015 Document Reviewed: 03/06/2014 Elsevier Interactive Patient Education 2016 Elsevier Inc.  
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
## 2016-11-02 ENCOUNTER — Other Ambulatory Visit: Payer: Self-pay | Admitting: Family Medicine

## 2016-11-02 DIAGNOSIS — N63 Unspecified lump in unspecified breast: Secondary | ICD-10-CM

## 2016-11-23 ENCOUNTER — Ambulatory Visit
Admission: RE | Admit: 2016-11-23 | Discharge: 2016-11-23 | Disposition: A | Discharge status: Home / Self Care | Payer: Managed Care, Other (non HMO) | Source: Ambulatory Visit | Attending: Family Medicine | Admitting: Family Medicine

## 2016-11-23 ENCOUNTER — Other Ambulatory Visit: Payer: Self-pay | Admitting: Family Medicine

## 2016-11-23 DIAGNOSIS — N63 Unspecified lump in unspecified breast: Secondary | ICD-10-CM

## 2016-12-10 ENCOUNTER — Other Ambulatory Visit: Payer: Self-pay | Admitting: Family Medicine

## 2016-12-10 DIAGNOSIS — F988 Other specified behavioral and emotional disorders with onset usually occurring in childhood and adolescence: Secondary | ICD-10-CM

## 2016-12-15 ENCOUNTER — Other Ambulatory Visit: Payer: Self-pay | Admitting: Family Medicine

## 2016-12-15 DIAGNOSIS — F988 Other specified behavioral and emotional disorders with onset usually occurring in childhood and adolescence: Secondary | ICD-10-CM

## 2016-12-15 MED ORDER — AMPHETAMINE-DEXTROAMPHET ER 20 MG PO CP24: 20.0000 mg | ORAL_CAPSULE | ORAL | 0 refills | Status: DC

## 2016-12-15 NOTE — Telephone Encounter (Signed)
Last OV: 08/30/16 Next OV: 02/27/17 UDS: 01/13/16 needs to repeat when she picks up Rxs.  Rx printed and forwarded to PCP for signature.

## 2016-12-15 NOTE — Telephone Encounter (Signed)
See previous email from 12/10/16.

## 2016-12-16 NOTE — Telephone Encounter (Signed)
Called the patient left a detailed message hardcopy (3 months worh) also attached message to update UDS>

## 2016-12-21 MED FILL — DEXTROAMP-AMPHET ER 20 MG C: 20 | 30 days supply | Qty: 30 | Fill #0

## 2016-12-23 ENCOUNTER — Encounter: Payer: Self-pay | Admitting: Family Medicine

## 2017-01-21 MED FILL — DEXTROAMP-AMPHET ER 20 MG C: 20 | 30 days supply | Qty: 30 | Fill #0

## 2017-03-12 IMAGING — MG MM DIAG BREAST TOMO UNI LEFT
5 series · 6 of 13 positions shown · non-contrast
Comparison: Previous exam(s).

CLINICAL DATA: The patient was called back for a possible left
breast mass

EXAM:
2D DIGITAL DIAGNOSTIC LEFT MAMMOGRAM WITH IMPLANTS, CAD AND ADJUNCT
TOMO
ULTRASOUND LEFT BREAST
The patient has retropectoral implants. Standard and implant
displaced views were performed.

[L MLO]
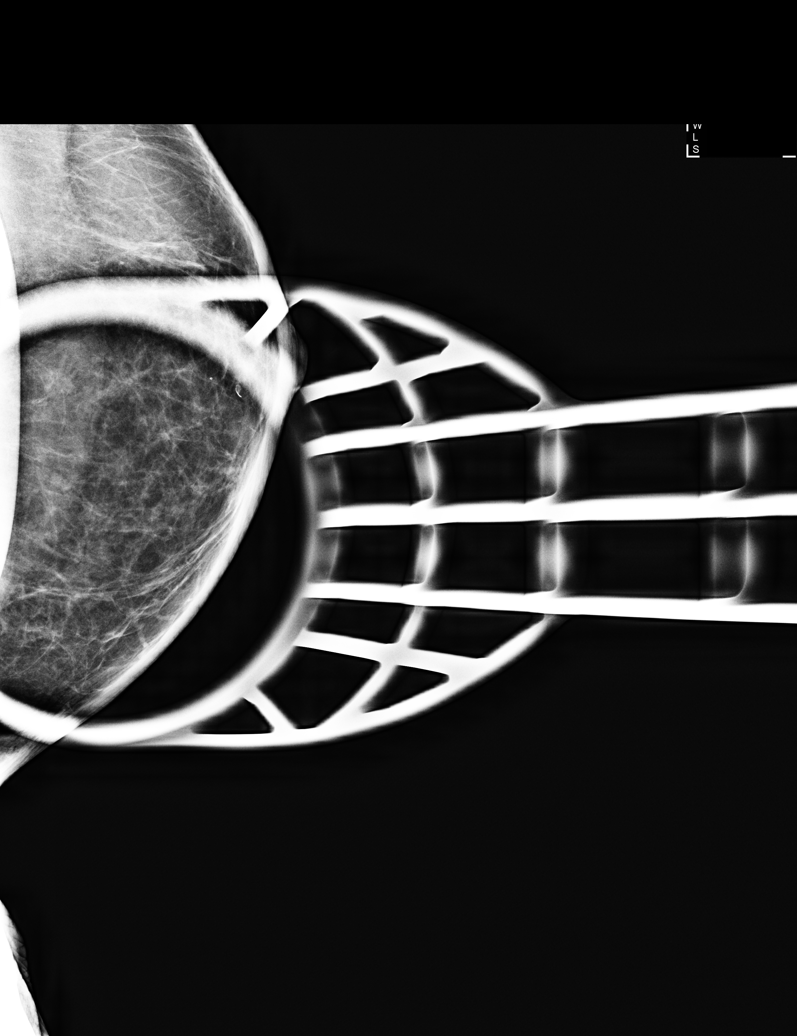

[L ML synth-2D]
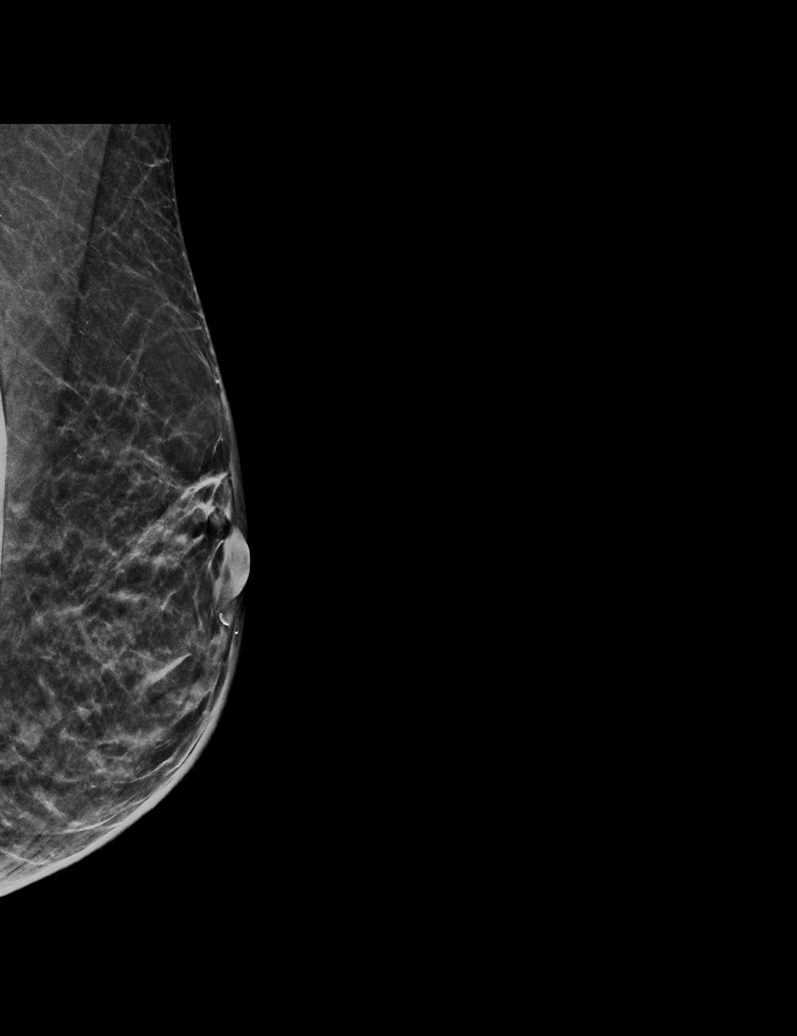

[L ML]
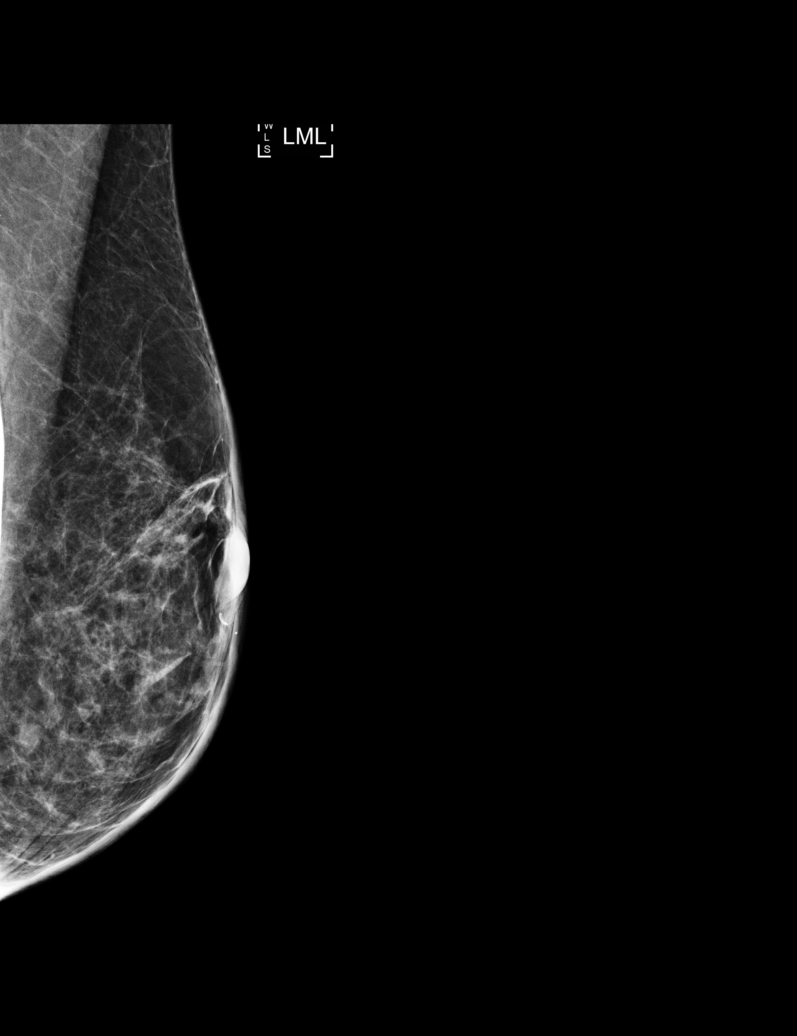

[L MLO tomo · 2 of 39 frames shown]
[frame 13/39]
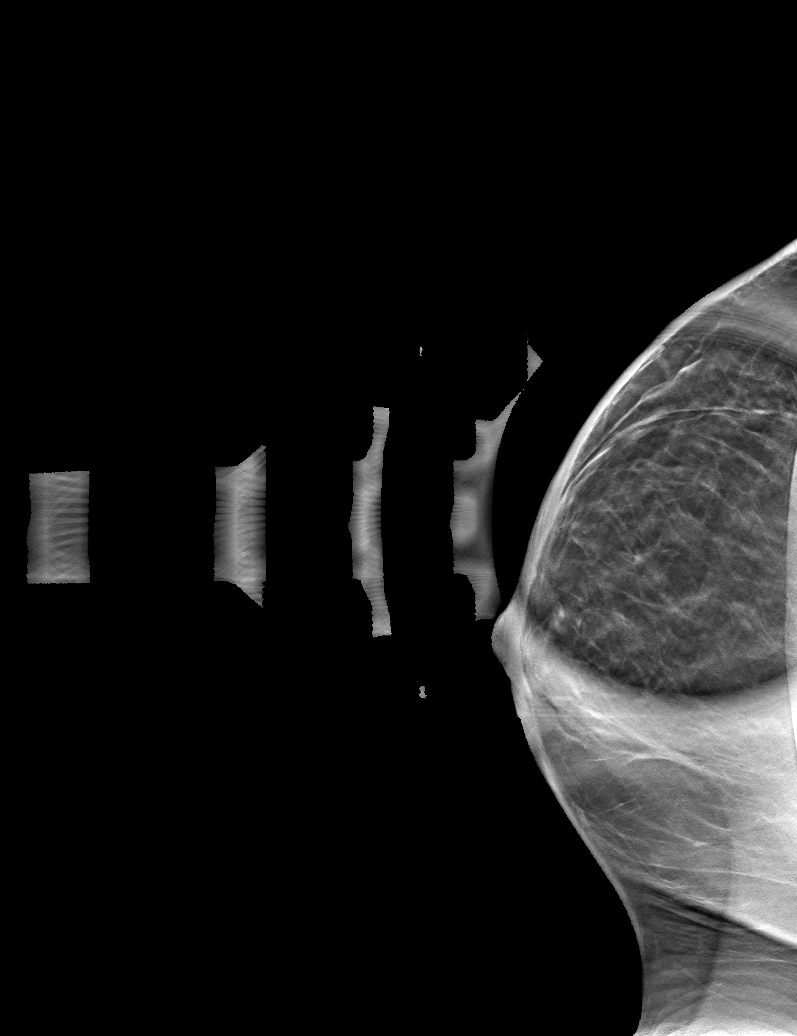
[frame 20/39]
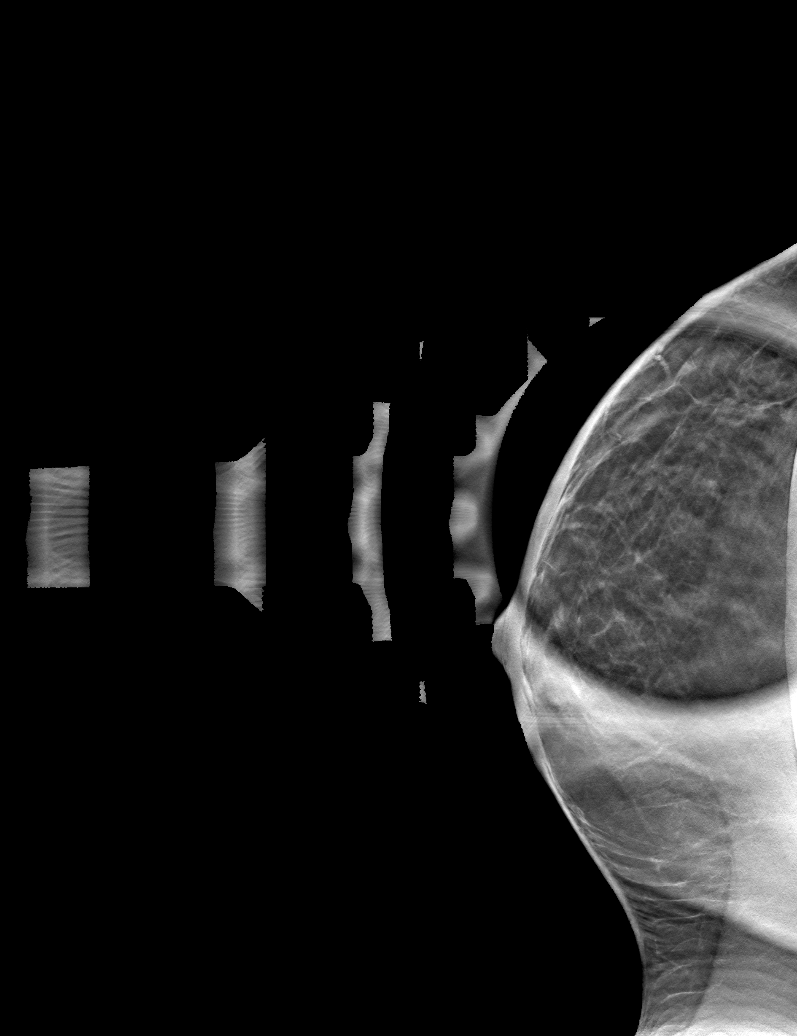

[L ML tomo · tomo slice 21/41.0]
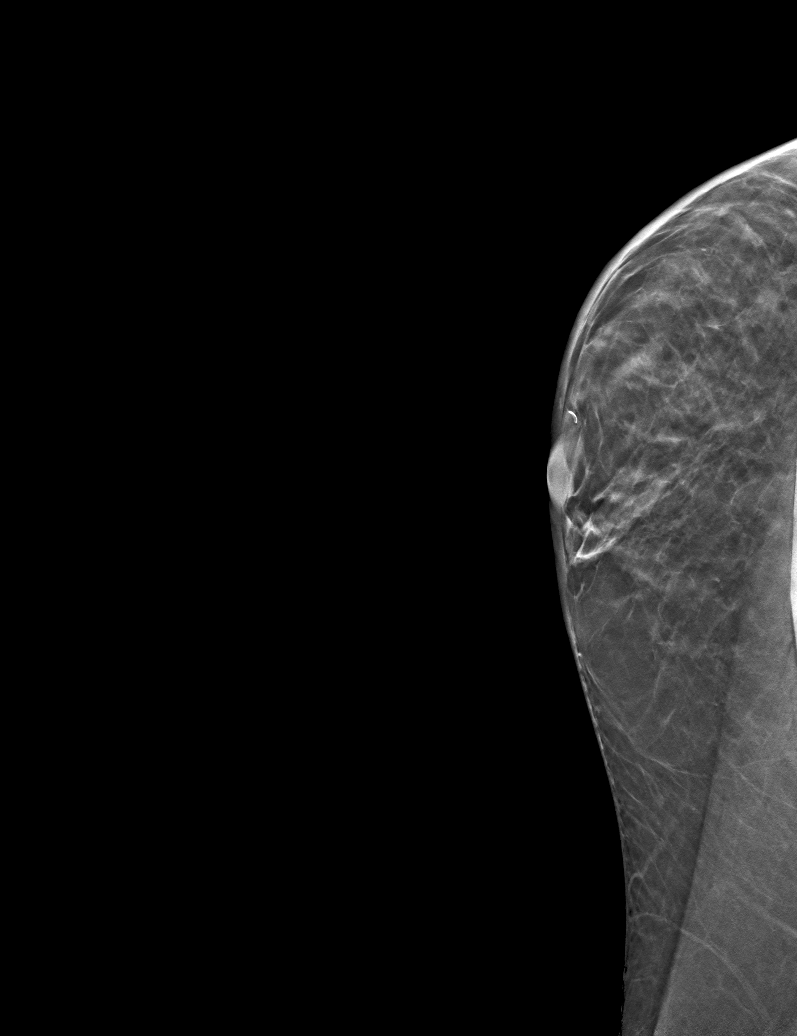

[6 of 13 positions shown; findings below may reference images not displayed]

ACR Breast Density Category b: There are scattered areas of
fibroglandular density.
FINDINGS: The possible left breast mass is not well seen on spot images but is
apparently real based on the 90 degree lateral view.

On physical exam, no suspicious lumps are identified.

Targeted ultrasound is performed, showing 2 hypoechoic masses in the
left breast. The first is seen at 3 o'clock, 4 cm from the nipple
measuring 7 x 5 x 2 mm. The second is seen at 5 o'clock, 6 cm from
the nipple measuring 5 x 6 x 2 mm.

Mammographic images were processed with CAD.
IMPRESSION: Probably benign masses in the left breast.

RECOMMENDATION:
Six-month follow-up mammogram and ultrasound of the probably benign
left breast masses.

I have discussed the findings and recommendations with the patient.
Results were also provided in writing at the conclusion of the
visit. If applicable, a reminder letter will be sent to the patient
regarding the next appointment.

BI-RADS CATEGORY  3: Probably benign.

## 2017-03-18 MED FILL — DEXTROAMP-AMPHET ER 20 MG C: 20 | 30 days supply | Qty: 30 | Fill #0

## 2017-05-12 ENCOUNTER — Other Ambulatory Visit: Payer: Self-pay | Admitting: Family Medicine

## 2017-05-12 ENCOUNTER — Encounter: Payer: Self-pay | Admitting: Family Medicine

## 2017-05-12 DIAGNOSIS — F988 Other specified behavioral and emotional disorders with onset usually occurring in childhood and adolescence: Secondary | ICD-10-CM

## 2017-05-12 MED ORDER — AMPHETAMINE-DEXTROAMPHET ER 20 MG PO CP24: 20.0000 mg | ORAL_CAPSULE | ORAL | 0 refills | Status: DC

## 2017-05-20 ENCOUNTER — Encounter: Payer: Self-pay | Admitting: Family Medicine

## 2017-05-20 MED FILL — DEXTROAMP-AMPHET ER 20 MG C: 20 | 30 days supply | Qty: 30 | Fill #0

## 2017-06-22 ENCOUNTER — Telehealth: Payer: Self-pay | Admitting: Family Medicine

## 2017-06-22 NOTE — Telephone Encounter (Signed)
Relation to [id number] Call back number:[phone]   Reason for call:  Patient traveling out of town tomorrow flight leaves at [place], patient requesting "Norethisterone" to delay her menstrual, requesting Rx today please send to  [facility], [place] - [address] AT [place] [phone] (Phone) [phone] (Fax)   Informed patient PCP is out of the office, patient states she feels symptoms of her menstrual approaching and would like to take prior to her period, patient also states if DOD doesn't prescribe can PCP please send to   [facility] [address] [place] [address] [phone]   Tomorrow, please advise

## 2017-06-23 ENCOUNTER — Other Ambulatory Visit: Payer: Self-pay | Admitting: Family Medicine

## 2017-06-23 DIAGNOSIS — N921 Excessive and frequent menstruation with irregular cycle: Secondary | ICD-10-CM

## 2017-06-23 MED ORDER — NORETHINDRONE-ETH ESTRADIOL 1-35 MG-MCG PO TABS: 1.0000 | ORAL_TABLET | Freq: Every day | ORAL | 11 refills | Status: DC

## 2017-06-23 NOTE — Telephone Encounter (Signed)
Called in.

## 2017-08-17 ENCOUNTER — Other Ambulatory Visit: Payer: Self-pay | Admitting: Family Medicine

## 2017-08-17 DIAGNOSIS — F988 Other specified behavioral and emotional disorders with onset usually occurring in childhood and adolescence: Secondary | ICD-10-CM

## 2017-08-25 ENCOUNTER — Ambulatory Visit (INDEPENDENT_AMBULATORY_CARE_PROVIDER_SITE_OTHER): Payer: 59 | Admitting: Family Medicine

## 2017-08-25 ENCOUNTER — Encounter: Payer: Self-pay | Admitting: Family Medicine

## 2017-08-25 DIAGNOSIS — F988 Other specified behavioral and emotional disorders with onset usually occurring in childhood and adolescence: Secondary | ICD-10-CM | POA: Diagnosis not present

## 2017-08-25 MED ORDER — AMPHETAMINE-DEXTROAMPHET ER 20 MG PO CP24: 20.0000 mg | ORAL_CAPSULE | ORAL | 0 refills | Status: DC

## 2017-08-25 MED FILL — DEXTROAMP-AMPHET ER 20 MG C: 20 | 30 days supply | Qty: 30 | Fill #0

## 2017-08-25 NOTE — Progress Notes (Signed)
Patient ID: Nicole Drake, female    DOB: [birth year]  Age: 41 y.o. MRN: [id number]    Subjective:  Subjective  HPI Nicole Drake presents for f/u add and she is requesting something to postpone her period but she will f/u with her gyn  Review of Systems  Constitutional: Negative for activity change, appetite change, fatigue and unexpected weight change.  Respiratory: Negative for cough and shortness of breath.   Cardiovascular: Negative for chest pain and palpitations.  Psychiatric/Behavioral: Negative for behavioral problems and dysphoric mood. The patient is not nervous/anxious.     History No past medical history on file.  She has a past surgical history that includes Tonsillectomy; Ovarian cyst removal; and Cervix surgery.   Her family history includes Heart disease (age of onset: 41) in her paternal grandfather; Heart disease (age of onset: 28) in her father; Heart disease (age of onset: 89) in her maternal grandfather.She reports that she quit smoking about a year ago. Her smoking use included Cigarettes. She has a 4.50 pack-year smoking history. She has never used smokeless tobacco. She reports that she drinks alcohol. She reports that she does not use drugs.  Current Outpatient Prescriptions on File Prior to Visit  Medication Sig Dispense Refill  . norethindrone-ethinyl estradiol 1/35 ([place] 1/35, 28,) tablet Take 1 tablet by mouth daily. (Patient not taking: Reported on 08/25/2017) 1 Package 11   No current facility-administered medications on file prior to visit.      Objective:  Objective  Physical Exam  Constitutional: She is oriented to person, place, and time. She appears well-developed and well-nourished.  HENT:  Head: Normocephalic and atraumatic.  Eyes: Conjunctivae and EOM are normal.  Neck: Normal range of motion. Neck supple. No JVD present. Carotid bruit is not present. No thyromegaly present.  Cardiovascular: Normal rate, regular rhythm and normal  heart sounds.   No murmur heard. Pulmonary/Chest: Effort normal and breath sounds normal. No respiratory distress. She has no wheezes. She has no rales. She exhibits no tenderness.  Musculoskeletal: She exhibits no edema.  Neurological: She is alert and oriented to person, place, and time.  Psychiatric: She has a normal mood and affect.  Nursing note and vitals reviewed.  BP 98/62 (BP Location: Right Arm, Patient Position: Sitting, Cuff Size: Normal)   Pulse 78   Temp 97.8 F (36.6 C) (Oral)   Ht 5\' 3"  (1.6 m)   Wt 120 lb 9.6 oz (54.7 kg)   LMP 08/12/2017   SpO2 98%   BMI 21.36 kg/m  Wt Readings from Last 3 Encounters:  08/25/17 120 lb 9.6 oz (54.7 kg)  08/30/16 114 lb 6.4 oz (51.9 kg)  06/22/16 113 lb 9.6 oz (51.5 kg)     Lab Results  Component Value Date   WBC 4.2 (L) 11/29/2013   HGB 13.1 11/29/2013   HCT 37.9 11/29/2013   PLT 242.0 11/29/2013   GLUCOSE 79 11/29/2013   CHOL 134 11/29/2013   TRIG 53.0 11/29/2013   HDL 57.30 11/29/2013   LDLCALC 66 11/29/2013   ALT 15 11/29/2013   AST 20 11/29/2013   NA 139 11/29/2013   K 3.7 11/29/2013   CL 106 11/29/2013   CREATININE 0.7 11/29/2013   BUN 13 11/29/2013   CO2 26 11/29/2013   TSH 1.65 11/29/2013    US Breast Ltd Uni Left Inc Axilla  Result Date: 11/23/2016 CLINICAL DATA:  Six-month follow-up for left breast nodules. EXAM: 2D DIGITAL DIAGNOSTIC LEFT MAMMOGRAM WITH CAD AND ADJUNCT TOMO ULTRASOUND  LEFT BREAST COMPARISON:  05/04/2016 ACR Breast Density Category b: There are scattered areas of fibroglandular density. FINDINGS: The patient has retropectoral implants. Standard and implant displaced views were performed. Within the posterior central portion of the left breast there is a circumscribed mass in the best seen on craniocaudal standard and spot compression views. No suspicious distortion or microcalcifications are identified. Mammographic images were processed with CAD. On physical exam, I palpate no abnormality  in the lower outer quadrant of the left breast or the left portion of the left breast. Targeted ultrasound is performed, showing interval resolution of the fibrocystic change in the 3 o'clock location of the left breast. In the 5 o'clock location 5 cm from the nipple a simple cyst is now imaged, measuring 0.5 x 0.3 x 0.4 cm. No solid mass or acoustic shadowing identified. IMPRESSION: 1. Simple cysts in the 5 o'clock location of the left breast. 2. Interval resolution of probable fibrocystic changes in the 3 o'clock location of the left breast. RECOMMENDATION: Bilateral screening mammogram is recommended in 6 months. I have discussed the findings and recommendations with the patient. Results were also provided in writing at the conclusion of the visit. If applicable, a reminder letter will be sent to the patient regarding the next appointment. BI-RADS CATEGORY  2: Benign. Electronically Signed   By: Nolon Nations M.D.   On: 11/23/2016 12:43   Mm Diag Breast W/implant Tomo Uni L  Result Date: 11/23/2016 CLINICAL DATA:  Six-month follow-up for left breast nodules. EXAM: 2D DIGITAL DIAGNOSTIC LEFT MAMMOGRAM WITH CAD AND ADJUNCT TOMO ULTRASOUND LEFT BREAST COMPARISON:  05/04/2016 ACR Breast Density Category b: There are scattered areas of fibroglandular density. FINDINGS: The patient has retropectoral implants. Standard and implant displaced views were performed. Within the posterior central portion of the left breast there is a circumscribed mass in the best seen on craniocaudal standard and spot compression views. No suspicious distortion or microcalcifications are identified. Mammographic images were processed with CAD. On physical exam, I palpate no abnormality in the lower outer quadrant of the left breast or the left portion of the left breast. Targeted ultrasound is performed, showing interval resolution of the fibrocystic change in the 3 o'clock location of the left breast. In the 5 o'clock location 5 cm  from the nipple a simple cyst is now imaged, measuring 0.5 x 0.3 x 0.4 cm. No solid mass or acoustic shadowing identified. IMPRESSION: 1. Simple cysts in the 5 o'clock location of the left breast. 2. Interval resolution of probable fibrocystic changes in the 3 o'clock location of the left breast. RECOMMENDATION: Bilateral screening mammogram is recommended in 6 months. I have discussed the findings and recommendations with the patient. Results were also provided in writing at the conclusion of the visit. If applicable, a reminder letter will be sent to the patient regarding the next appointment. BI-RADS CATEGORY  2: Benign. Electronically Signed   By: Nolon Nations M.D.   On: 11/23/2016 12:43     Assessment & Plan:  Plan  I have discontinued Ms. Florance's fluticasone and levocetirizine. I am also having her maintain her norethindrone-ethinyl estradiol 1/35, amphetamine-dextroamphetamine, amphetamine-dextroamphetamine, and amphetamine-dextroamphetamine.  Meds ordered this encounter  Medications  . amphetamine-dextroamphetamine (ADDERALL XR) 20 MG 24 hr capsule    Sig: Take 1 capsule (20 mg total) by mouth every morning.    Dispense:  30 capsule    Refill:  0    Do not fill until Nov 2018  . amphetamine-dextroamphetamine (ADDERALL XR) 20 MG  24 hr capsule    Sig: Take 1 capsule (20 mg total) by mouth every morning.    Dispense:  30 capsule    Refill:  0    Do not fill until October 2018  . amphetamine-dextroamphetamine (ADDERALL XR) 20 MG 24 hr capsule    Sig: Take 1 capsule (20 mg total) by mouth every morning.    Dispense:  30 capsule    Refill:  0    Problem List Items Addressed This Visit      Unprioritized   ADD (attention deficit disorder)   Relevant Medications   amphetamine-dextroamphetamine (ADDERALL XR) 20 MG 24 hr capsule   amphetamine-dextroamphetamine (ADDERALL XR) 20 MG 24 hr capsule   amphetamine-dextroamphetamine (ADDERALL XR) 20 MG 24 hr capsule    refill meds and f/u  6 months  Follow-up: Return in about 6 months (around 02/22/2018).  Ann Held, DO

## 2017-10-17 ENCOUNTER — Encounter: Payer: Self-pay | Admitting: Obstetrics & Gynecology

## 2017-10-17 ENCOUNTER — Ambulatory Visit: Payer: 59 | Admitting: Obstetrics & Gynecology

## 2017-10-17 ENCOUNTER — Other Ambulatory Visit (HOSPITAL_COMMUNITY): Payer: Self-pay | Admitting: Obstetrics & Gynecology

## 2017-10-17 VITALS — BP 101/61 | HR 76 | Ht 63.0 in | Wt 125.0 lb

## 2017-10-17 DIAGNOSIS — R300 Dysuria: Secondary | ICD-10-CM

## 2017-10-17 DIAGNOSIS — Z0000 Encounter for general adult medical examination without abnormal findings: Secondary | ICD-10-CM

## 2017-10-17 DIAGNOSIS — N938 Other specified abnormal uterine and vaginal bleeding: Secondary | ICD-10-CM | POA: Diagnosis not present

## 2017-10-17 MED ORDER — MEGESTROL ACETATE 40 MG PO TABS: 40.0000 mg | ORAL_TABLET | Freq: Two times a day (BID) | ORAL | 5 refills | Status: DC

## 2017-10-17 MED FILL — MEGESTROL 40 MG TABLET: 40 | 30 days supply | Qty: 60 | Fill #0

## 2017-10-17 NOTE — Progress Notes (Signed)
Patient ID: Nicole Drake, female   DOB: [birth year], 41 y.o.   MRN: [id number]  No chief complaint on file.   HPI Nicole Drake is a 41 y.o. female.   HPI 41 yo MW P2 (21 and 49 yo kids) here today as a new patient with the issue of continuous bleeding for the last 14 days. Yesterday she felt some urinary dribbling. She has had urinary pressure for the preceding week.  With regard to the bleeding, Her periods are normally "a little crazy". She has been having periods about every 3 weeks, but never bleeds this long, usually 3-5 days.  Her husband has had a vasectomy.  She has not tried any meds to try to help this issue.  Last year her gynecologist at [facility] gave her a medication to slow down her bleeding with her periods.  History reviewed. No pertinent past medical history.  Past Surgical History:  Procedure Laterality Date  . CERVIX SURGERY     partial remove/age 87  . OVARIAN CYST REMOVAL     age 73, 74, 42  . TONSILLECTOMY     age 74    Family History  Problem Relation Age of Onset  . Heart disease Father 19       MI  . Heart disease Maternal Grandfather 80       MI  . Heart disease Paternal Grandfather 66       MI    Social History Social History   Tobacco Use  . Smoking status: Former Smoker    Packs/day: 0.25    Years: 18.00    Pack years: 4.50    Types: Cigarettes    Last attempt to quit: 08/23/2016    Years since quitting: 1.1  . Smokeless tobacco: Never Used  Substance Use Topics  . Alcohol use: Yes    Comment: OCC  . Drug use: No    Allergies  Allergen Reactions  . Penicillins Rash    Current Outpatient Medications  Medication Sig Dispense Refill  . amphetamine-dextroamphetamine (ADDERALL XR) 20 MG 24 hr capsule Take 1 capsule (20 mg total) by mouth every morning. (Patient not taking: Reported on 10/17/2017) 30 capsule 0  . amphetamine-dextroamphetamine (ADDERALL XR) 20 MG 24 hr capsule Take 1 capsule (20 mg total) by mouth every morning.  (Patient not taking: Reported on 10/17/2017) 30 capsule 0  . amphetamine-dextroamphetamine (ADDERALL XR) 20 MG 24 hr capsule Take 1 capsule (20 mg total) by mouth every morning. (Patient not taking: Reported on 10/17/2017) 30 capsule 0  . carisoprodol (SOMA) 350 MG tablet     . norethindrone-ethinyl estradiol 1/35 ([place] 1/35, 28,) tablet Take 1 tablet by mouth daily. (Patient not taking: Reported on 08/25/2017) 1 Package 11   No current facility-administered medications for this visit.     Review of Systems Review of Systems  Last pap smear about a year and a half ago, normal for the last 10 years, had cryo in the distant past She is leaving at the end of the week for a 10 day trip to [place] and would like the bleeding to stop prior to this trip.   Blood pressure 101/61, pulse 76, height 5\' 3"  (1.6 m), weight 125 lb (56.7 kg), last menstrual period 10/03/2017.  Physical Exam Physical Exam Breathing, conversing, and ambulating normally Well nourished, well hydrated white female, no apparent distress Moderate amount of blood at os, no lesion NSSAV and anteflexed, NT, no masses, mobile, no adnexal masses or tenderness Data Reviewed  RECOMMENDATION: Bilateral screening mammogram is recommended in 6 months.  I have discussed the findings and recommendations with the patient. Results were also provided in writing at the conclusion of the visit. If applicable, a reminder letter will be sent to the patient regarding the next appointment.  BI-RADS CATEGORY  2: Benign. Assessment    Extended period Preventative care    Plan    Gyn u/s ordered Declines flu vaccine Mammogram ordered Megace 40 mg BID        Nicole Drake 10/17/2017, 10:02 AM

## 2017-10-18 ENCOUNTER — Telehealth: Payer: Self-pay

## 2017-10-18 ENCOUNTER — Ambulatory Visit (HOSPITAL_BASED_OUTPATIENT_CLINIC_OR_DEPARTMENT_OTHER)
Admission: RE | Admit: 2017-10-18 | Discharge: 2017-10-18 | Disposition: A | Discharge status: Home / Self Care | Payer: 59 | Source: Ambulatory Visit | Attending: Obstetrics & Gynecology | Admitting: Obstetrics & Gynecology

## 2017-10-18 DIAGNOSIS — N83202 Unspecified ovarian cyst, left side: Secondary | ICD-10-CM | POA: Diagnosis not present

## 2017-10-18 DIAGNOSIS — D259 Leiomyoma of uterus, unspecified: Secondary | ICD-10-CM | POA: Diagnosis not present

## 2017-10-18 DIAGNOSIS — N938 Other specified abnormal uterine and vaginal bleeding: Secondary | ICD-10-CM | POA: Diagnosis not present

## 2017-10-18 LAB — CBC
Hematocrit: 37.1 % (ref 34.0–46.6)
Hemoglobin: 12.3 g/dL (ref 11.1–15.9)
MCH: 30.8 pg (ref 26.6–33.0)
MCHC: 33.2 g/dL (ref 31.5–35.7)
MCV: 93 fL (ref 79–97)
PLATELETS: 328 10*3/uL (ref 150–379)
RBC: 3.99 x10E6/uL (ref 3.77–5.28)
RDW: 12.8 % (ref 12.3–15.4)
WBC: 7.8 10*3/uL (ref 3.4–10.8)

## 2017-10-18 LAB — TSH: TSH: 2.45 u[IU]/mL (ref 0.450–4.500)

## 2017-10-18 NOTE — Telephone Encounter (Signed)
[facility] radiology called with ultrasound report on patient and would like physician review report. Kathrene Alu RNBSN

## 2017-10-20 ENCOUNTER — Telehealth: Payer: Self-pay

## 2017-10-20 LAB — CULTURE, URINE COMPREHENSIVE

## 2017-10-20 NOTE — Telephone Encounter (Signed)
Patient called and made aware of ultrasound findings. Patient made aware that Dr. Hulan Fray would like her to have a CA-125 drawn. Patient scheduled to come into clinic tomorrow for blood draw.  Patient would like Dr. Hulan Fray to know she has taken the Megace since Monday and her bleeding has slowed down but NOT completely quit. Patient would like Dr. Hulan Fray to know since she is going on vacation Saturday in case there is another prescription she could try to get bleeding to stop. Assured patient I would forward message to provider for input. Kathrene Alu RN BSN

## 2017-10-20 NOTE — Telephone Encounter (Signed)
-----   Message from Emily Filbert, MD sent at 10/19/2017  3:34 PM EST ----- Please have her come by and get a CA-125 drawn as she has an ovarian cyst. Probably nothing, just being cautious.

## 2017-10-21 ENCOUNTER — Ambulatory Visit: Payer: 59

## 2017-10-21 DIAGNOSIS — N83209 Unspecified ovarian cyst, unspecified side: Secondary | ICD-10-CM

## 2017-10-21 MED ORDER — MEDROXYPROGESTERONE ACETATE 10 MG PO TABS: 10.0000 mg | ORAL_TABLET | Freq: Every day | ORAL | 0 refills | Status: DC

## 2017-10-21 MED FILL — DEXTROAMP-AMPHET ER 20 MG C: 20 | 30 days supply | Qty: 30 | Fill #0

## 2017-10-21 NOTE — Progress Notes (Signed)
Patient sent to lab for blood draw. Kathrene Alu RNBSN

## 2017-10-21 NOTE — Telephone Encounter (Signed)
She is welcome to try provera 10 mg daily.  Thanks  M.Dove   Patient called and made aware of change of medication to Provera. Patient states understanding and verifies pharmacy. Kathrene Alu RNBSN

## 2017-10-22 LAB — CA 125: Cancer Antigen (CA) 125: 14.3 U/mL (ref 0.0–38.1)

## 2017-10-31 ENCOUNTER — Telehealth: Payer: Self-pay

## 2017-10-31 NOTE — Telephone Encounter (Signed)
Patient called stating that she is still bleeding after switching to the Provera. Patient states while out of town she had to be evaluated at an urgent care for pain and bleeding. Patient would like to see Dr. Hulan Fray again. Patient given appointment in [place] due to Dr. Hulan Fray not being at [facility] until mid December. Kathrene Alu RNBSN

## 2017-11-02 ENCOUNTER — Encounter: Payer: Self-pay | Admitting: Obstetrics & Gynecology

## 2017-11-02 ENCOUNTER — Ambulatory Visit: Payer: 59 | Admitting: Obstetrics & Gynecology

## 2017-11-02 VITALS — BP 112/74 | HR 109 | Wt 123.0 lb

## 2017-11-02 DIAGNOSIS — N926 Irregular menstruation, unspecified: Secondary | ICD-10-CM | POA: Diagnosis not present

## 2017-11-02 DIAGNOSIS — Z3202 Encounter for pregnancy test, result negative: Secondary | ICD-10-CM

## 2017-11-02 LAB — POCT URINE PREGNANCY: Preg Test, Ur: NEGATIVE

## 2017-11-02 MED ORDER — NORGESTREL-ETHINYL ESTRADIOL 0.3-30 MG-MCG PO TABS: 1.0000 | ORAL_TABLET | Freq: Every day | ORAL | 11 refills | Status: DC

## 2017-11-02 NOTE — Progress Notes (Signed)
Pt was seen at urgent care this past Saturday in [place] for pelvic pain. They did U/S, cyst had not ruptured. Gave her pain meds. Pain is now gone.Pt has been bleeding for 30 days.

## 2017-11-02 NOTE — Progress Notes (Signed)
Patient ID: Nicole Drake, female   DOB: 1975/12/16, 41 y.o.   MRN: [id number]  No chief complaint on file.   HPI Nicole Drake is a 41 y.o. female.   HPI 41 yo MW P2 with irregular bleeding, initially seen at the [facility] office on 10-17-17. She took megace and then provera and still had bleeding.  No past medical history on file.  Past Surgical History:  Procedure Laterality Date  . CERVIX SURGERY     partial remove/age 68  . OVARIAN CYST REMOVAL     age 52, 78, 66  . TONSILLECTOMY     age 33    Family History  Problem Relation Age of Onset  . Heart disease Father 49       MI  . Heart disease Maternal Grandfather 80       MI  . Heart disease Paternal Grandfather 15       MI    Social History Social History   Tobacco Use  . Smoking status: Former Smoker    Packs/day: 0.25    Years: 18.00    Pack years: 4.50    Types: Cigarettes    Last attempt to quit: 08/23/2016    Years since quitting: 1.1  . Smokeless tobacco: Never Used  Substance Use Topics  . Alcohol use: Yes    Comment: OCC  . Drug use: No    Allergies  Allergen Reactions  . Penicillins Rash    Current Outpatient Medications  Medication Sig Dispense Refill  . amphetamine-dextroamphetamine (ADDERALL XR) 20 MG 24 hr capsule Take 1 capsule (20 mg total) by mouth every morning. (Patient not taking: Reported on 10/17/2017) 30 capsule 0  . amphetamine-dextroamphetamine (ADDERALL XR) 20 MG 24 hr capsule Take 1 capsule (20 mg total) by mouth every morning. (Patient not taking: Reported on 10/17/2017) 30 capsule 0  . amphetamine-dextroamphetamine (ADDERALL XR) 20 MG 24 hr capsule Take 1 capsule (20 mg total) by mouth every morning. (Patient not taking: Reported on 10/17/2017) 30 capsule 0  . carisoprodol (SOMA) 350 MG tablet     . medroxyPROGESTERone (PROVERA) 10 MG tablet Take 1 tablet (10 mg total) daily by mouth. 12 tablet 0  . megestrol (MEGACE) 40 MG tablet Take 1 tablet (40 mg total) 2 (two) times daily  by mouth. 60 tablet 5  . norethindrone-ethinyl estradiol 1/35 ([place] 1/35, 28,) tablet Take 1 tablet by mouth daily. (Patient not taking: Reported on 08/25/2017) 1 Package 11   No current facility-administered medications for this visit.     Review of Systems Review of Systems  Blood pressure 112/74, pulse (!) 109, weight 123 lb (55.8 kg), last menstrual period 10/03/2017.  Physical Exam Physical Exam Breathing, conversing, and ambulating normally  UPT negative, consent signed, time out done Cervix prepped with betadine and grasped with a single tooth tenaculum Uterus sounded to 9 cm Pipelle used for 2 passes with a moderate amount of tissue obtained. She tolerated the procedure well.  She has some moderate uterine prolapse and a 1st degree cystocele She has an excellent pubic arch for vaginal surgery.   Data Reviewed  IMPRESSION: 1. Small fundal myometrial fibroid is 1.5 cm. 2. Normal endometrial thickness. If bleeding remains unresponsive to hormonal or medical therapy, sonohysterogram should be considered for focal lesion work-up. (Ref: Radiological Reasoning: Algorithmic Workup of Abnormal Vaginal Bleeding with Endovaginal Sonography and Sonohysterography. AJR 2008; 272:Z36-64) 3. 7.2 cm left ovarian cyst with single internal septation. Since these may be difficult to  assess completely with [place], further evaluation of simple-appearing cysts >7 cm with MRI or surgical evaluation is recommended according to the Society of Radiologists in Ultrasound 2010 Consensus Conference Statement (D Clovis Riley et al. Management of Asymptomatic Ovarian and other Adnexal Cysts Imaged at [place]: Society of Radiologists in [place] Statement 2010. Radiology 256 (Sept 2010): 185-909.). These results will be called to the ordering clinician or representative by the Radiologist Assistant, and communication documented in the PACS or zVision Dashboard.   Results for  SU, DUMA (MRN [id number]) as of 11/02/2017 10:07  Ref. Range 10/17/2017 00:00  WBC Latest Ref Range: 3.4 - 10.8 x10E3/uL 7.8  RBC Latest Ref Range: 3.77 - 5.28 x10E6/uL 3.99  Hemoglobin Latest Ref Range: 11.1 - 15.9 g/dL 12.3  HCT Latest Ref Range: 34.0 - 46.6 % 37.1  MCV Latest Ref Range: 79 - 97 fL 93  MCH Latest Ref Range: 26.6 - 33.0 pg 30.8  MCHC Latest Ref Range: 31.5 - 35.7 g/dL 33.2  RDW Latest Ref Range: 12.3 - 15.4 % 12.8  Platelets Latest Ref Range: 150 - 379 x10E3/uL 328  TSH Latest Ref Range: 0.450 - 4.500 uIU/mL 2.450   Assessment    DUB    Plan    Await pathology Start OCPs, discussed Mirena, ablation, d&c, TVH       Sheryle Vice C Avary Eichenberger 11/02/2017, 10:07 AM

## 2017-12-20 MED FILL — DEXTROAMP-AMPHET ER 20 MG C: 20 | 30 days supply | Qty: 30 | Fill #0

## 2018-03-27 ENCOUNTER — Telehealth: Payer: Self-pay | Admitting: Family Medicine

## 2018-03-27 ENCOUNTER — Encounter: Payer: Self-pay | Admitting: Family Medicine

## 2018-03-27 ENCOUNTER — Ambulatory Visit: Payer: 59 | Admitting: Family Medicine

## 2018-03-27 ENCOUNTER — Ambulatory Visit (HOSPITAL_BASED_OUTPATIENT_CLINIC_OR_DEPARTMENT_OTHER)
Admission: RE | Admit: 2018-03-27 | Discharge: 2018-03-27 | Disposition: A | Discharge status: Home / Self Care | Payer: 59 | Source: Ambulatory Visit | Attending: Family Medicine | Admitting: Family Medicine

## 2018-03-27 VITALS — BP 110/70 | HR 92 | Temp 98.2°F | Ht 63.0 in | Wt 129.1 lb

## 2018-03-27 DIAGNOSIS — M25531 Pain in right wrist: Secondary | ICD-10-CM | POA: Insufficient documentation

## 2018-03-27 NOTE — Patient Instructions (Addendum)
Ice/cold pack over area for 10-15 min twice daily.  OK to take Tylenol 1000 mg (2 extra strength tabs) or 975 mg (3 regular strength tabs) every 6 hours as needed.  Ibuprofen 400-600 mg (2-3 over the counter strength tabs) every 6 hours as needed for pain.  Wear the brace at night, in public or if you are doing anything that causes pain.   Cancel the appointment if you are doing better. Specifically ask to waive fee and run it by me if cancelling within 24 business hours.   If the radiologist sees anything that I didn't, I will let you know.   Wrist and Forearm Exercises It is normal to feel mild stretching, pulling, tightness, or discomfort as you do these exercises, but you should stop right away if you feel sudden pain or your pain gets worse.   RANGE OF MOTION EXERCISES These exercises warm up your muscles and joints and improve the movement and flexibility of your injured wrist and forearm. These exercises also help to relieve pain, numbness, and tingling. These exercises are done using the muscles in your injured wrist and forearm. Exercise A: Wrist Flexion, Active 1. With your fingers relaxed, bend your wrist forward as far as you can. 2. Hold this position for 30 seconds. Repeat 2 times. Complete this exercise 3 times per week. Exercise B: Wrist Extension, Active 1. With your fingers relaxed, bend your wrist backward as far as you can. 2. Hold this position for 30 seconds. Repeat 2 times. Complete this exercise 3 times per week. Exercise C: Supination, Active  1. Stand or sit with your arms at your sides. 2. Bend your left / right elbow to an "L" shape (90 degrees). 3. Turn your palm upward until you feel a gentle stretch on the inside of your forearm. 4. Hold this position for 30 seconds. 5. Slowly return your palm to the starting position. Repeat 2 times. Complete this exercise 3 times per week. Exercise D: Pronation, Active  1. Stand or sit with your arms at your  sides. 2. Bend your left / right elbow to an "L" shape (90 degrees). 3. Turn your palm downward until you feel a gentle stretch on the top of your forearm. 4. Hold this position for 30 seconds. 5. Slowly return your palm to the starting position. Repeat 2 times. Complete this exercise once a day.  STRETCHING EXERCISES These exercises warm up your muscles and joints and improve the movement and flexibility of your injured wrist and forearm. These exercises also help to relieve pain, numbness, and tingling. These exercises are done using your healthy wrist and forearm to help stretch the muscles in your injured wrist and forearm. Exercise E: Wrist Flexion, Passive  1. Extend your left / right arm in front of you, relax your wrist, and point your fingers downward. 2. Gently push on the back of your hand. Stop when you feel a gentle stretch on the top of your forearm. 3. Hold this position for 30 seconds. Repeat 2 times. Complete this exercise 3 times per week. Exercise F: Wrist Extension, Passive  1. Extend your left / right arm in front of you and turn your palm upward. 2. Gently pull your palm and fingertips back so your fingers point downward. You should feel a gentle stretch on the palm-side of your forearm. 3. Hold this position for 30 seconds. Repeat 2 times. Complete this exercise 3 times per week. Exercise G: Forearm Rotation, Supination, Passive 1. Sit with your  left / right elbow bent to an "L" shape (90 degrees) with your forearm resting on a table. 2. Keeping your upper body and shoulder still, use your other hand to rotate your forearm palm-up until you feel a gentle to moderate stretch. 3. Hold this position for 30 seconds. 4. Slowly release the stretch and return to the starting position. Repeat 2 times. Complete this exercise 3 times per week. Exercise H: Forearm Rotation, Pronation, Passive 1. Sit with your left / right elbow bent to an "L" shape (90 degrees) with your  forearm resting on a table. 2. Keeping your upper body and shoulder still, use your other hand to rotate your forearm palm-down until you feel a gentle to moderate stretch. 3. Hold this position for 30 seconds. 4. Slowly release the stretch and return to the starting position. Repeat 2 times. Complete this exercise 3 times per week.  STRENGTHENING EXERCISES These exercises build strength and endurance in your wrist and forearm. Endurance is the ability to use your muscles for a long time, even after they get tired. Exercise I: Wrist Flexors  1. Sit with your left / right forearm supported on a table and your hand resting palm-up over the edge of the table. Your elbow should be bent to an "L" shape (about 90 degrees) and be below the level of your shoulder. 2. Hold a 3-5 lb weight in your left / right hand. Or, hold a rubber exercise band or tube in both hands, keeping your hands at the same level and hip distance apart. There should be a slight tension in the exercise band or tube. 3. Slowly curl your hand up toward your forearm. 4. Hold this position for 3 seconds. 5. Slowly lower your hand back to the starting position. Repeat 2 times. Complete this exercise 3 times per week. Exercise J: Wrist Extensors  1. Sit with your left / right forearm supported on a table and your hand resting palm-down over the edge of the table. Your elbow should be bent to an "L" shape (about 90 degrees) and be below the level of your shoulder. 2. Hold a 3-5 lb weight in your left / right hand. Or, hold a rubber exercise band or tube in both hands, keeping your hands at the same level and hip distance apart. There should be a slight tension in the exercise band or tube. 3. Slowly curl your hand up toward your forearm. 4. Hold this position for 3 seconds. 5. Slowly lower your hand back to the starting position. Repeat 2 times. Complete this exercise 3 times per week. Exercise K: Forearm Rotation,  Supination  1. Sit with your left / right forearm supported on a table and your hand resting palm-down. Your elbow should be at your side, bent to an "L" shape (about 90 degrees), and below the level of your shoulder. Keep your wrist stable and in a neutral position throughout the exercise. 2. Gently hold a lightweight hammer with your left / right hand. 3. Without moving your elbow or wrist, slowly rotate your palm upward to a thumbs-up position. 4. Hold this position for 3 seconds. 5. Slowly return your forearm to the starting position. Repeat 2 times. Complete this exercise 3 times per week. Exercise L: Forearm Rotation, Pronation  1. Sit with your left / right forearm supported on a table and your hand resting palm-up. Your elbow should be at your side, bent to an "L" shape (about 90 degrees), and below the level of your  shoulder. Keep your wrist stable. Do not allow it to move backward or forward during the exercise. 2. Gently hold a lightweight hammer with your left / right hand. 3. Without moving your elbow or wrist, slowly rotate your palm and hand upward to a thumbs-up position. 4. Hold this position for 3 seconds. 5. Slowly return your forearm to the starting position. Repeat 2 times. Complete this exercise 3 times per week. Exercise M: Grip Strengthening  1. Hold one of these items in your left / right hand: play dough, therapy putty, a dense sponge, a stress ball, or a large, rolled sock. 2. Squeeze as hard as you can without increasing pain. 3. Hold this position for 5 seconds. 4. Slowly release your grip. Repeat 2 times. Complete this exercise 3 times per week. Make sure you discuss any questions you have with your health care provider. Document Released: 10/13/2005 Document Revised: 08/23/2016 Document Reviewed: 08/24/2015 Elsevier Interactive Patient Education  [employer].
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
## 2018-03-27 NOTE — Telephone Encounter (Signed)
Left message on machine that we will refill at ov.  She has to follow up first, uds, and contract.

## 2018-03-27 NOTE — Telephone Encounter (Signed)
Pharmacy updated for [place] only

## 2018-03-27 NOTE — Telephone Encounter (Signed)
Copied from [place] [id number]. Topic: Quick Communication - See Telephone Encounter >> Mar 27, 2018  4:23 PM Rosalin Hawking wrote: CRM for notification. See Telephone encounter for: 03/27/18.   Pt was seen today 03-27-2018 and forgot to mentioned that would like to have her pharmacy updated with [place], pt just wanted to make sure if she gets any rx if possible to have it sent to [facility].

## 2018-03-27 NOTE — Progress Notes (Signed)
Pre visit review using our clinic review tool, if applicable. No additional management support is needed unless otherwise documented below in the visit note. 

## 2018-03-30 ENCOUNTER — Encounter (HOSPITAL_BASED_OUTPATIENT_CLINIC_OR_DEPARTMENT_OTHER): Payer: Self-pay

## 2018-03-30 ENCOUNTER — Other Ambulatory Visit: Payer: Self-pay | Admitting: Obstetrics & Gynecology

## 2018-03-30 ENCOUNTER — Ambulatory Visit (HOSPITAL_BASED_OUTPATIENT_CLINIC_OR_DEPARTMENT_OTHER)
Admission: RE | Admit: 2018-03-30 | Discharge: 2018-03-30 | Disposition: A | Discharge status: Home / Self Care | Payer: 59 | Source: Ambulatory Visit | Attending: Obstetrics & Gynecology | Admitting: Obstetrics & Gynecology

## 2018-03-30 DIAGNOSIS — Z0000 Encounter for general adult medical examination without abnormal findings: Secondary | ICD-10-CM

## 2018-03-30 DIAGNOSIS — Z1231 Encounter for screening mammogram for malignant neoplasm of breast: Secondary | ICD-10-CM | POA: Insufficient documentation

## 2018-03-30 DIAGNOSIS — R928 Other abnormal and inconclusive findings on diagnostic imaging of breast: Secondary | ICD-10-CM | POA: Insufficient documentation

## 2018-04-07 ENCOUNTER — Ambulatory Visit: Payer: 59 | Admitting: Family Medicine

## 2018-04-07 VITALS — BP 106/60 | HR 94 | Resp 16 | Ht 63.0 in | Wt 131.0 lb

## 2018-04-07 DIAGNOSIS — Z79899 Other long term (current) drug therapy: Secondary | ICD-10-CM

## 2018-04-07 DIAGNOSIS — F988 Other specified behavioral and emotional disorders with onset usually occurring in childhood and adolescence: Secondary | ICD-10-CM

## 2018-04-07 MED ORDER — AMPHETAMINE-DEXTROAMPHET ER 20 MG PO CP24: 20.0000 mg | ORAL_CAPSULE | ORAL | 0 refills | Status: DC

## 2018-04-07 MED FILL — DEXTROAMP-AMPHET ER 20 MG C: 20 | 30 days supply | Qty: 30 | Fill #0

## 2018-04-07 NOTE — Progress Notes (Signed)
Patient ID: Nicole Drake, female    DOB: August 18, 1976  Age: 42 y.o. MRN: [id number]    Subjective:  Subjective  HPI Nicole Drake presents for add f/u.   No complaints.  She would like to restart the medication --- she is starting classes again.    Review of Systems  Constitutional: Negative for activity change, appetite change, diaphoresis, fatigue and unexpected weight change.  Eyes: Negative for pain, redness and visual disturbance.  Respiratory: Negative for cough, chest tightness, shortness of breath and wheezing.   Cardiovascular: Negative for chest pain, palpitations and leg swelling.  Endocrine: Negative for cold intolerance, heat intolerance, polydipsia, polyphagia and polyuria.  Genitourinary: Negative for difficulty urinating, dysuria and frequency.  Neurological: Negative for dizziness, light-headedness, numbness and headaches.  Psychiatric/Behavioral: Negative for behavioral problems and dysphoric mood. The patient is not nervous/anxious.     History No past medical history on file.  She has a past surgical history that includes Tonsillectomy; Ovarian cyst removal; Cervix surgery; and Augmentation mammaplasty.   Her family history includes Heart disease (age of onset: 91) in her paternal grandfather; Heart disease (age of onset: 23) in her father; Heart disease (age of onset: 66) in her maternal grandfather.She reports that she quit smoking about 19 months ago. Her smoking use included cigarettes. She has a 4.50 pack-year smoking history. She has never used smokeless tobacco. She reports that she drinks alcohol. She reports that she does not use drugs.  Current Outpatient Medications on File Prior to Visit  Medication Sig Dispense Refill  . carisoprodol (SOMA) 350 MG tablet     . medroxyPROGESTERone (PROVERA) 10 MG tablet Take 1 tablet (10 mg total) daily by mouth. 12 tablet 0  . megestrol (MEGACE) 40 MG tablet Take 1 tablet (40 mg total) 2 (two) times daily by mouth. 60  tablet 5  . norgestrel-ethinyl estradiol (LO/OVRAL,CRYSELLE) 0.3-30 MG-MCG tablet Take 1 tablet by mouth daily. 1 Package 11   No current facility-administered medications on file prior to visit.      Objective:  Objective  Physical Exam  Constitutional: She is oriented to person, place, and time. She appears well-developed and well-nourished.  HENT:  Head: Normocephalic and atraumatic.  Eyes: Conjunctivae and EOM are normal.  Neck: Normal range of motion. Neck supple. No JVD present. Carotid bruit is not present. No thyromegaly present.  Cardiovascular: Normal rate, regular rhythm and normal heart sounds.  No murmur heard. Pulmonary/Chest: Effort normal and breath sounds normal. No respiratory distress. She has no wheezes. She has no rales. She exhibits no tenderness.  Musculoskeletal: She exhibits no edema.  Neurological: She is alert and oriented to person, place, and time.  Psychiatric: She has a normal mood and affect.  Nursing note and vitals reviewed.  BP 106/60 (BP Location: Left Arm, Patient Position: Sitting, Cuff Size: Normal)   Pulse 94   Resp 16   Ht 5\' 3"  (1.6 m)   Wt 131 lb (59.4 kg)   SpO2 99%   BMI 23.21 kg/m  Wt Readings from Last 3 Encounters:  04/07/18 131 lb (59.4 kg)  03/27/18 129 lb 2 oz (58.6 kg)  11/02/17 123 lb (55.8 kg)     Lab Results  Component Value Date   WBC 7.8 10/17/2017   HGB 12.3 10/17/2017   HCT 37.1 10/17/2017   PLT 328 10/17/2017   GLUCOSE 79 11/29/2013   CHOL 134 11/29/2013   TRIG 53.0 11/29/2013   HDL 57.30 11/29/2013   LDLCALC 66 11/29/2013  ALT 15 11/29/2013   AST 20 11/29/2013   NA 139 11/29/2013   K 3.7 11/29/2013   CL 106 11/29/2013   CREATININE 0.7 11/29/2013   BUN 13 11/29/2013   CO2 26 11/29/2013   TSH 2.450 10/17/2017    Mm 3d Screen Breast W/implant Bilateral  Result Date: 03/31/2018 CLINICAL DATA:  Screening. EXAM: DIGITAL SCREENING BILATERAL MAMMOGRAM WITH IMPLANTS, CAD AND TOMO The patient has  retropectoral implants. Standard and implant displaced views were performed. COMPARISON:  Previous exam(s). ACR Breast Density Category b: There are scattered areas of fibroglandular density. FINDINGS: There are no findings suspicious for malignancy. Images were processed with CAD. IMPRESSION: No mammographic evidence of malignancy. A result letter of this screening mammogram will be mailed directly to the patient. RECOMMENDATION: Screening mammogram in one year. (Code:SM-B-01Y) BI-RADS CATEGORY  1:  Negative. Electronically Signed   By: Franki Cabot M.D.   On: 03/31/2018 13:41     Assessment & Plan:  Plan  I have discontinued Nicole Drake. Nicole Drake's norethindrone-ethinyl estradiol 1/35. I am also having her maintain her carisoprodol, megestrol, medroxyPROGESTERone, norgestrel-ethinyl estradiol, amphetamine-dextroamphetamine, amphetamine-dextroamphetamine, and amphetamine-dextroamphetamine.  Meds ordered this encounter  Medications  . amphetamine-dextroamphetamine (ADDERALL XR) 20 MG 24 hr capsule    Sig: Take 1 capsule (20 mg total) by mouth every morning.    Dispense:  30 capsule    Refill:  0    Do not fill until June 2019  . amphetamine-dextroamphetamine (ADDERALL XR) 20 MG 24 hr capsule    Sig: Take 1 capsule (20 mg total) by mouth every morning.    Dispense:  30 capsule    Refill:  0    Do not fill until  May 07, 2018  . amphetamine-dextroamphetamine (ADDERALL XR) 20 MG 24 hr capsule    Sig: Take 1 capsule (20 mg total) by mouth every morning.    Dispense:  30 capsule    Refill:  0    Problem List Items Addressed This Visit      Unprioritized   ADD (attention deficit disorder)   Relevant Medications   amphetamine-dextroamphetamine (ADDERALL XR) 20 MG 24 hr capsule   amphetamine-dextroamphetamine (ADDERALL XR) 20 MG 24 hr capsule   amphetamine-dextroamphetamine (ADDERALL XR) 20 MG 24 hr capsule    Other Visit Diagnoses    High risk medication use    -  Primary   Relevant Orders     Pain Mgmt, Profile 8 w/Conf, U    stable-- will restart adderall due to pt starting school again  Recheck 6 months   Follow-up: Return in about 6 months (around 10/07/2018) for add.  Ann Held, DO

## 2018-04-08 LAB — PAIN MGMT, PROFILE 8 W/CONF, U
6 ACETYLMORPHINE: NEGATIVE ng/mL (ref <10)
ALCOHOL METABOLITES: NEGATIVE ng/mL (ref <500)
AMPHETAMINES: NEGATIVE ng/mL (ref <500)
Benzodiazepines: NEGATIVE ng/mL (ref <100)
Buprenorphine, Urine: NEGATIVE ng/mL (ref <5)
COCAINE METABOLITE: NEGATIVE ng/mL (ref <150)
CREATININE: 19.7 mg/dL — AB
MARIJUANA METABOLITE: NEGATIVE ng/mL (ref <20)
MDMA: NEGATIVE ng/mL (ref <500)
OPIATES: NEGATIVE ng/mL (ref <100)
Oxidant: NEGATIVE ug/mL (ref <200)
Oxycodone: NEGATIVE ng/mL (ref <100)
SPECIFIC GRAVITY: 1.004 (ref >=1.0)
pH: 6.77 (ref 4.5–9.0)

## 2018-04-09 ENCOUNTER — Encounter: Payer: Self-pay | Admitting: Family Medicine

## 2018-05-09 MED FILL — DEXTROAMP-AMPHET ER 20 MG C: 20 | 30 days supply | Qty: 30 | Fill #0

## 2018-07-27 MED FILL — DEXTROAMP-AMPHET ER 20 MG C: 20 | 30 days supply | Qty: 30 | Fill #0

## 2018-09-14 ENCOUNTER — Other Ambulatory Visit: Payer: Self-pay | Admitting: Obstetrics & Gynecology

## 2018-09-14 ENCOUNTER — Telehealth: Payer: Self-pay | Admitting: *Deleted

## 2018-09-14 MED ORDER — DROSPIRENONE-ETHINYL ESTRADIOL 3-0.02 MG PO TABS: 1.0000 | ORAL_TABLET | Freq: Every day | ORAL | 11 refills | Status: DC

## 2018-09-14 NOTE — Telephone Encounter (Signed)
Pt called requested that her OCP's be changed because she was having bloating and weight gain.  Spoke with Dr Hulan Fray and she has changed it to [facility](generic) and RX was sent to [facility] in [place].  LM on voicmeail that we have changed the meds.

## 2018-09-14 NOTE — Progress Notes (Signed)
Wants yaz

## 2018-10-30 ENCOUNTER — Other Ambulatory Visit: Payer: Self-pay | Admitting: Obstetrics & Gynecology

## 2018-11-27 ENCOUNTER — Other Ambulatory Visit: Payer: Self-pay | Admitting: Family Medicine

## 2018-11-27 DIAGNOSIS — F988 Other specified behavioral and emotional disorders with onset usually occurring in childhood and adolescence: Secondary | ICD-10-CM

## 2018-11-27 NOTE — Telephone Encounter (Signed)
Copied from [place] [id number]. Topic: Quick Communication - Rx Refill/Question >> Nov 27, 2018 12:18 PM Hewitt Shorts wrote: Medication: adderall  Has the patient contacted their pharmacy? Yes  (Agent: If no, request that the patient contact the pharmacy for the refill.) (Agent: If yes, when and what did the pharmacy advise?)she just asked if she had any refills left not to have it called in  Preferred Pharmacy (with phone number or street name): [facility]   Agent: Please be advised that RX refills may take up to 3 business days. We ask that you follow-up with your pharmacy.

## 2018-11-30 DIAGNOSIS — J00 Acute nasopharyngitis [common cold]: Secondary | ICD-10-CM | POA: Diagnosis not present

## 2018-12-01 MED ORDER — AMPHETAMINE-DEXTROAMPHET ER 20 MG PO CP24: 20.0000 mg | ORAL_CAPSULE | ORAL | 0 refills | Status: DC

## 2018-12-01 NOTE — Telephone Encounter (Signed)
Last adderall XR RX: 04/07/18, #30 x 3 RXs Last OV: 04/07/18 Next OV: PAST DUE. Was due 10/07/18 UDS: 04/07/18, low risk CSC: 04/07/18 CSR: No discrepancies identified

## 2018-12-19 MED FILL — AMPHETAMINE-DEXTROAMPHET ER: 20 | 30 days supply | Qty: 30 | Fill #0

## 2019-03-21 ENCOUNTER — Other Ambulatory Visit: Payer: Self-pay | Admitting: Family Medicine

## 2019-03-21 DIAGNOSIS — F988 Other specified behavioral and emotional disorders with onset usually occurring in childhood and adolescence: Secondary | ICD-10-CM

## 2019-03-28 ENCOUNTER — Encounter: Payer: Self-pay | Admitting: Family Medicine

## 2019-03-28 ENCOUNTER — Other Ambulatory Visit: Payer: Self-pay

## 2019-03-28 ENCOUNTER — Ambulatory Visit (INDEPENDENT_AMBULATORY_CARE_PROVIDER_SITE_OTHER): Payer: Self-pay | Admitting: Family Medicine

## 2019-03-28 DIAGNOSIS — F988 Other specified behavioral and emotional disorders with onset usually occurring in childhood and adolescence: Secondary | ICD-10-CM

## 2019-03-28 DIAGNOSIS — J302 Other seasonal allergic rhinitis: Secondary | ICD-10-CM

## 2019-03-28 MED ORDER — AMPHETAMINE-DEXTROAMPHET ER 20 MG PO CP24: 20.0000 mg | ORAL_CAPSULE | ORAL | 0 refills | Status: DC

## 2019-03-28 NOTE — Progress Notes (Signed)
Virtual Visit via Video Note  I connected with Trinna Post on 03/28/19 at  9:00 AM EDT by a video enabled telemedicine application and verified that I am speaking with the correct person using two identifiers.   I discussed the limitations of evaluation and management by telemedicine and the availability of in person appointments. The patient expressed understanding and agreed to proceed.  History of Present Illness: Pt is home f/u for add.   Pt has c/o sore throat and nasal drainage also .  No fever,  No cough.  She is just wondering if there is something she can try otc.  No problems with her add meds.  No cp, no sob.       Provider-  Working from home  Observations/Objective: 110/70   p 82 , afebrile,  Pt in NAD   Not toxic looking  Assessment and Plan:  1. Attention deficit disorder (ADD) without hyperactivity Stable,  No side effects to meds con't meds rto 6 months or sooner prn  - amphetamine-dextroamphetamine (ADDERALL XR) 20 MG 24 hr capsule; Take 1 capsule (20 mg total) by mouth every morning.  Dispense: 30 capsule; Refill: 0 - amphetamine-dextroamphetamine (ADDERALL XR) 20 MG 24 hr capsule; Take 1 capsule (20 mg total) by mouth every morning.  Dispense: 30 capsule; Refill: 0 - amphetamine-dextroamphetamine (ADDERALL XR) 20 MG 24 hr capsule; Take 1 capsule (20 mg total) by mouth every morning.  Dispense: 30 capsule; Refill: 0  2. Seasonal allergies otc xyzal or other antihistamine and steroid nasal spray ie flonase  rto or call prn   Follow Up Instructions:    I discussed the assessment and treatment plan with the patient. The patient was provided an opportunity to ask questions and all were answered. The patient agreed with the plan and demonstrated an understanding of the instructions.   The patient was advised to call back or seek an in-person evaluation if the symptoms worsen or if the condition fails to improve as anticipated.     Ann Held, DO

## 2019-09-03 ENCOUNTER — Other Ambulatory Visit: Payer: Self-pay | Admitting: Family Medicine

## 2019-09-03 DIAGNOSIS — F988 Other specified behavioral and emotional disorders with onset usually occurring in childhood and adolescence: Secondary | ICD-10-CM

## 2019-09-04 MED ORDER — AMPHETAMINE-DEXTROAMPHET ER 20 MG PO CP24: 20.0000 mg | ORAL_CAPSULE | ORAL | 0 refills | Status: DC

## 2019-09-04 NOTE — Telephone Encounter (Signed)
Requesting: Adderall XR Contract: 04/07/2018 UDS: 04/07/2018, low risk Last OV: 03/28/2019 Next OV: N/A Last Refill: 03/28/2019, #30- 2 RF Database:   Please advise

## 2019-10-17 DIAGNOSIS — Z8041 Family history of malignant neoplasm of ovary: Secondary | ICD-10-CM | POA: Diagnosis not present

## 2019-10-17 DIAGNOSIS — Z01419 Encounter for gynecological examination (general) (routine) without abnormal findings: Secondary | ICD-10-CM | POA: Diagnosis not present

## 2019-10-17 DIAGNOSIS — Z6823 Body mass index (BMI) 23.0-23.9, adult: Secondary | ICD-10-CM | POA: Diagnosis not present

## 2019-11-07 DIAGNOSIS — R070 Pain in throat: Secondary | ICD-10-CM | POA: Diagnosis not present

## 2019-11-07 DIAGNOSIS — K529 Noninfective gastroenteritis and colitis, unspecified: Secondary | ICD-10-CM | POA: Diagnosis not present

## 2019-11-07 DIAGNOSIS — H10022 Other mucopurulent conjunctivitis, left eye: Secondary | ICD-10-CM | POA: Diagnosis not present

## 2019-11-27 DIAGNOSIS — Z1231 Encounter for screening mammogram for malignant neoplasm of breast: Secondary | ICD-10-CM | POA: Diagnosis not present

## 2020-03-25 DIAGNOSIS — Z30430 Encounter for insertion of intrauterine contraceptive device: Secondary | ICD-10-CM | POA: Diagnosis not present

## 2020-03-25 DIAGNOSIS — N921 Excessive and frequent menstruation with irregular cycle: Secondary | ICD-10-CM | POA: Diagnosis not present

## 2020-05-08 ENCOUNTER — Ambulatory Visit (INDEPENDENT_AMBULATORY_CARE_PROVIDER_SITE_OTHER): Payer: BC Managed Care – PPO | Admitting: Family Medicine

## 2020-05-08 ENCOUNTER — Other Ambulatory Visit: Payer: Self-pay

## 2020-05-08 VITALS — BP 98/60 | HR 84 | Temp 97.5°F | Resp 18 | Ht 63.0 in

## 2020-05-08 DIAGNOSIS — Z79899 Other long term (current) drug therapy: Secondary | ICD-10-CM | POA: Diagnosis not present

## 2020-05-08 DIAGNOSIS — F988 Other specified behavioral and emotional disorders with onset usually occurring in childhood and adolescence: Secondary | ICD-10-CM | POA: Diagnosis not present

## 2020-05-08 NOTE — Progress Notes (Signed)
Patient ID: Nicole Drake, female    DOB: 1976-04-17  Age: 44 y.o. MRN: [id number]    Subjective:  Subjective  HPI Nicole Drake presents for f/u add.  Nicole Drake has not complaints.   The med are working well.    Review of Systems  Constitutional: Negative for appetite change, diaphoresis, fatigue and unexpected weight change.  Eyes: Negative for pain, redness and visual disturbance.  Respiratory: Negative for cough, chest tightness, shortness of breath and wheezing.   Cardiovascular: Negative for chest pain, palpitations and leg swelling.  Endocrine: Negative for cold intolerance, heat intolerance, polydipsia, polyphagia and polyuria.  Genitourinary: Negative for difficulty urinating, dysuria and frequency.  Neurological: Negative for dizziness, light-headedness, numbness and headaches.    History No past medical history on file.  Nicole Drake has a past surgical history that includes Tonsillectomy; Ovarian cyst removal; Cervix surgery; and Augmentation mammaplasty.   Her family history includes Heart disease (age of onset: 19) in her paternal grandfather; Heart disease (age of onset: 33) in her father; Heart disease (age of onset: 84) in her maternal grandfather.Nicole Drake reports that Nicole Drake quit smoking about 3 years ago. Her smoking use included cigarettes. Nicole Drake has a 4.50 pack-year smoking history. Nicole Drake has never used smokeless tobacco. Nicole Drake reports current alcohol use. Nicole Drake reports that Nicole Drake does not use drugs.  Current Outpatient Medications on File Prior to Visit  Medication Sig Dispense Refill  . Nicole Drake 3-0.02 MG tablet TAKE 1 TABLET BY MOUTH EVERY DAY 84 tablet 4   No current facility-administered medications on file prior to visit.     Objective:  Objective  Physical Exam Vitals and nursing note reviewed.  Constitutional:      Appearance: Nicole Drake is well-developed.  HENT:     Head: Normocephalic and atraumatic.  Eyes:     Conjunctiva/sclera: Conjunctivae normal.  Neck:     Thyroid: No  thyromegaly.     Vascular: No carotid bruit or JVD.  Cardiovascular:     Rate and Rhythm: Normal rate and regular rhythm.     Heart sounds: Normal heart sounds. No murmur.  Pulmonary:     Effort: Pulmonary effort is normal. No respiratory distress.     Breath sounds: Normal breath sounds. No wheezing or rales.  Chest:     Chest wall: No tenderness.  Musculoskeletal:     Cervical back: Normal range of motion and neck supple.  Neurological:     Mental Status: Nicole Drake is alert and oriented to person, place, and time.    BP 98/60 (BP Location: Right Arm, Patient Position: Sitting, Cuff Size: Normal)   Pulse 84   Temp (!) 97.5 F (36.4 C) (Temporal)   Resp 18   Ht 5\' 3"  (1.6 m)   SpO2 97%   BMI 23.21 kg/m  Wt Readings from Last 3 Encounters:  04/07/18 131 lb (59.4 kg)  03/27/18 129 lb 2 oz (58.6 kg)  11/02/17 123 lb (55.8 kg)     Lab Results  Component Value Date   WBC 7.8 10/17/2017   HGB 12.3 10/17/2017   HCT 37.1 10/17/2017   PLT 328 10/17/2017   GLUCOSE 79 11/29/2013   CHOL 134 11/29/2013   TRIG 53.0 11/29/2013   HDL 57.30 11/29/2013   LDLCALC 66 11/29/2013   ALT 15 11/29/2013   AST 20 11/29/2013   NA 139 11/29/2013   K 3.7 11/29/2013   CL 106 11/29/2013   CREATININE 0.7 11/29/2013   BUN 13 11/29/2013   CO2 26 11/29/2013  TSH 2.450 10/17/2017    MM 3D SCREEN BREAST W/IMPLANT BILATERAL  Result Date: 03/31/2018 CLINICAL DATA:  Screening. EXAM: DIGITAL SCREENING BILATERAL MAMMOGRAM WITH IMPLANTS, CAD AND TOMO The patient has retropectoral implants. Standard and implant displaced views were performed. COMPARISON:  Previous exam(s). ACR Breast Density Category b: There are scattered areas of fibroglandular density. FINDINGS: There are no findings suspicious for malignancy. Images were processed with CAD. IMPRESSION: No mammographic evidence of malignancy. A result letter of this screening mammogram will be mailed directly to the patient. RECOMMENDATION: Screening  mammogram in one year. (Code:SM-B-01Y) BI-RADS CATEGORY  1:  Negative. Electronically Signed   By: Nicole Drake M.D.   On: 03/31/2018 13:41     Assessment & Plan:  Plan  I am having Nicole Drake maintain her Lexine Baton.  No orders of the defined types were placed in this encounter.   Problem List Items Addressed This Visit      Unprioritized   ADD (attention deficit disorder)    Pt having no problems with meds  Will refill x 3 months uds and contract updated       Relevant Orders   DRUG MONITORING, PANEL 8 WITH CONFIRMATION, URINE (Completed)    Other Visit Diagnoses    High risk medication use    -  Primary   Relevant Orders   DRUG MONITORING, PANEL 8 WITH CONFIRMATION, URINE (Completed)      Follow-up: No follow-ups on file.  Ann Held, DO

## 2020-05-09 ENCOUNTER — Telehealth: Payer: Self-pay

## 2020-05-09 ENCOUNTER — Other Ambulatory Visit: Payer: Self-pay | Admitting: Family Medicine

## 2020-05-09 DIAGNOSIS — F988 Other specified behavioral and emotional disorders with onset usually occurring in childhood and adolescence: Secondary | ICD-10-CM

## 2020-05-09 MED ORDER — AMPHETAMINE-DEXTROAMPHET ER 20 MG PO CP24: 20.0000 mg | ORAL_CAPSULE | ORAL | 0 refills | Status: DC

## 2020-05-09 NOTE — Telephone Encounter (Signed)
Request sent to [facility]

## 2020-05-09 NOTE — Telephone Encounter (Signed)
Medication: amphetamine-dextroamphetamine (ADDERALL XR) 20 MG 24 hr capsule      Has the patient contacted their pharmacy?  (If no, request that the patient contact the pharmacy for the refill.) (If yes, when and what did the pharmacy advise?)     Preferred Pharmacy (with phone number or street name): [id number] - [place] [place] [address]  [place], [place] [address]  Phone:  [phone] Fax:  [phone]      Agent: Please be advised that RX refills may take up to 3 business days. We ask that you follow-up with your pharmacy.

## 2020-05-09 NOTE — Telephone Encounter (Signed)
Requesting:Adderall Contract:04/18/2018 UDS:05/08/2020 Last Visit:05/08/2020 Next Visit:none scheduled yet Last Refill:03/27/2020 2 rx April, may  Please Advise

## 2020-05-09 NOTE — Telephone Encounter (Signed)
Patient called in to get a prescription refill for amphetamine-dextroamphetamine (ADDERALL XR) 20 MG 24 hr capsule [id number]    [facility] to [id number] - [place], [address] AT [facility]  [place], [place] [address]  Phone:  [phone] Fax:  [phone]  DEA #:  [id number]  Per the patient the pharmacy never received the first order that was sent by Mel Almond on this morning.

## 2020-05-10 LAB — DRUG MONITORING, PANEL 8 WITH CONFIRMATION, URINE
6 Acetylmorphine: NEGATIVE ng/mL (ref <10)
Alcohol Metabolites: POSITIVE ng/mL — AB
Amphetamines: NEGATIVE ng/mL (ref <500)
Benzodiazepines: NEGATIVE ng/mL (ref <100)
Buprenorphine, Urine: NEGATIVE ng/mL (ref <5)
Cocaine Metabolite: NEGATIVE ng/mL (ref <150)
Creatinine: 73.3 mg/dL
Ethyl Glucuronide (ETG): >100000 ng/mL — ABNORMAL HIGH (ref <500)
Ethyl Sulfate (ETS): 23063 ng/mL — ABNORMAL HIGH (ref <100)
MDMA: NEGATIVE ng/mL (ref <500)
Marijuana Metabolite: NEGATIVE ng/mL (ref <20)
Opiates: NEGATIVE ng/mL (ref <100)
Oxidant: NEGATIVE ug/mL
Oxycodone: NEGATIVE ng/mL (ref <100)
pH: 5.8 (ref 4.5–9.0)

## 2020-05-12 ENCOUNTER — Encounter: Payer: Self-pay | Admitting: Family Medicine

## 2020-05-12 NOTE — Assessment & Plan Note (Signed)
Pt having no problems with meds  Will refill x 3 months uds and contract updated

## 2020-05-12 NOTE — Patient Instructions (Signed)

## 2020-07-31 ENCOUNTER — Other Ambulatory Visit: Payer: Self-pay | Admitting: Family Medicine

## 2020-07-31 DIAGNOSIS — F988 Other specified behavioral and emotional disorders with onset usually occurring in childhood and adolescence: Secondary | ICD-10-CM

## 2020-07-31 MED ORDER — AMPHETAMINE-DEXTROAMPHET ER 20 MG PO CP24: 20.0000 mg | ORAL_CAPSULE | ORAL | 0 refills | Status: DC

## 2020-07-31 NOTE — Telephone Encounter (Signed)
Adderall refill.   Last OV: 05/08/2020 Last Fill: 05/09/2020 #30 and 0RF (For June, July, and August, she is requesting Rx for September 1) Pt sig: 1 capsule qd UDS: 05/08/2020 +alcohol

## 2020-08-01 MED FILL — AMPHETAMINE-DEXTROAMPHET ER: 20 | 30 days supply | Qty: 30 | Fill #0

## 2020-08-06 DIAGNOSIS — B360 Pityriasis versicolor: Secondary | ICD-10-CM | POA: Diagnosis not present

## 2020-08-13 MED FILL — AMPHETAMINE-DEXTROAMPHET ER: 20 | 30 days supply | Qty: 30 | Fill #0

## 2020-09-12 ENCOUNTER — Other Ambulatory Visit: Payer: Self-pay | Admitting: Family Medicine

## 2020-09-12 DIAGNOSIS — F988 Other specified behavioral and emotional disorders with onset usually occurring in childhood and adolescence: Secondary | ICD-10-CM

## 2020-09-12 MED FILL — AMPHETAMINE-DEXTROAMPHET ER: 20 | 30 days supply | Qty: 30 | Fill #0

## 2020-09-12 NOTE — Telephone Encounter (Signed)
Requesting: Adderall XR 20mg  Contract: 04/07/2018 UDS: 05/08/2020 +alcohol Last Visit: 05/08/2020 Next Visit: None scheduled Last Refill: 07/31/2020 #30 and 0RF Pt sig: 1 capsule qd  Please Advise

## 2020-10-10 ENCOUNTER — Other Ambulatory Visit: Payer: Self-pay | Admitting: Family Medicine

## 2020-10-10 DIAGNOSIS — F988 Other specified behavioral and emotional disorders with onset usually occurring in childhood and adolescence: Secondary | ICD-10-CM

## 2020-10-10 MED FILL — AMPHETAMINE-DEXTROAMPHET ER: 20 | 30 days supply | Qty: 30 | Fill #0

## 2020-10-10 NOTE — Telephone Encounter (Signed)
Last written: 09/12/20 Last ov: 05/08/20 Next ov: none Contract: 04/07/18 UDS: 05/08/20

## 2020-10-21 ENCOUNTER — Other Ambulatory Visit: Payer: Self-pay | Admitting: Family Medicine

## 2020-10-21 ENCOUNTER — Other Ambulatory Visit: Payer: Self-pay

## 2020-10-21 ENCOUNTER — Encounter: Payer: Self-pay | Admitting: Family Medicine

## 2020-10-21 ENCOUNTER — Ambulatory Visit (INDEPENDENT_AMBULATORY_CARE_PROVIDER_SITE_OTHER): Payer: BC Managed Care – PPO | Admitting: Family Medicine

## 2020-10-21 VITALS — BP 100/70 | HR 102 | Temp 98.6°F | Resp 18 | Ht 63.0 in | Wt 131.8 lb

## 2020-10-21 DIAGNOSIS — R591 Generalized enlarged lymph nodes: Secondary | ICD-10-CM | POA: Diagnosis not present

## 2020-10-21 MED ORDER — DOXYCYCLINE HYCLATE 100 MG PO TABS: 100.0000 mg | ORAL_TABLET | Freq: Two times a day (BID) | ORAL | 0 refills | Status: DC

## 2020-10-21 MED FILL — DOXYCYCLINE HYCLATE 100 MG: 100 | 10 days supply | Qty: 20 | Fill #0

## 2020-10-21 NOTE — Progress Notes (Signed)
Patient ID: Nicole Drake, female    DOB: 07/28/1976  Age: 44 y.o. MRN: [id number]    Subjective:  Subjective  HPI Nicole Drake presents for lump under chin---- it started with pain last sat and she noticed the lump yesterday and it is painful.   She has some diarrhea last week but no fever.   The diarrhea resolved after Friday.  No abd pain.  No uri symptoms   Review of Systems  Constitutional: Negative for appetite change, diaphoresis, fatigue and unexpected weight change.  HENT: Negative for congestion, dental problem, facial swelling, postnasal drip, sinus pressure, sinus pain, sneezing, sore throat, tinnitus, trouble swallowing and voice change.   Eyes: Negative for pain, redness and visual disturbance.  Respiratory: Negative for cough, chest tightness, shortness of breath and wheezing.   Cardiovascular: Negative for chest pain, palpitations and leg swelling.  Endocrine: Negative for cold intolerance, heat intolerance, polydipsia, polyphagia and polyuria.  Genitourinary: Negative for difficulty urinating, dysuria and frequency.  Neurological: Negative for dizziness, light-headedness, numbness and headaches.  Hematological: Positive for adenopathy.    History No past medical history on file.  She has a past surgical history that includes Tonsillectomy; Ovarian cyst removal; Cervix surgery; and Augmentation mammaplasty.   Her family history includes Heart disease (age of onset: 76) in her paternal grandfather; Heart disease (age of onset: 56) in her father; Heart disease (age of onset: 22) in her maternal grandfather.She reports that she quit smoking about 4 years ago. Her smoking use included cigarettes. She has a 4.50 pack-year smoking history. She has never used smokeless tobacco. She reports current alcohol use. She reports that she does not use drugs.  Current Outpatient Medications on File Prior to Visit  Medication Sig Dispense Refill  . amphetamine-dextroamphetamine  (ADDERALL XR) 20 MG 24 hr capsule TAKE 1 CAPSULE BY MOUTH EVERY MORNING 30 capsule 0   No current facility-administered medications on file prior to visit.     Objective:  Objective  Physical Exam Vitals and nursing note reviewed.  Constitutional:      Appearance: She is well-developed.  HENT:     Head: Normocephalic and atraumatic.  Eyes:     Conjunctiva/sclera: Conjunctivae normal.  Neck:     Thyroid: No thyromegaly.     Vascular: No carotid bruit or JVD.   Cardiovascular:     Rate and Rhythm: Normal rate and regular rhythm.     Heart sounds: Normal heart sounds. No murmur heard.   Pulmonary:     Effort: Pulmonary effort is normal. No respiratory distress.     Breath sounds: Normal breath sounds. No wheezing or rales.  Chest:     Chest wall: No tenderness.  Musculoskeletal:     Cervical back: Normal range of motion and neck supple. No erythema or crepitus. Normal range of motion.  Lymphadenopathy:     Cervical: No cervical adenopathy.  Neurological:     Mental Status: She is alert and oriented to person, place, and time.    BP 100/70 (BP Location: Right Arm, Patient Position: Sitting, Cuff Size: Normal)   Pulse (!) 102   Temp 98.6 F (37 C) (Oral)   Resp 18   Ht 5\' 3"  (1.6 m)   Wt 131 lb 12.8 oz (59.8 kg)   SpO2 95%   BMI 23.35 kg/m  Wt Readings from Last 3 Encounters:  10/21/20 131 lb 12.8 oz (59.8 kg)  04/07/18 131 lb (59.4 kg)  03/27/18 129 lb 2 oz (58.6 kg)  Lab Results  Component Value Date   WBC 7.8 10/17/2017   HGB 12.3 10/17/2017   HCT 37.1 10/17/2017   PLT 328 10/17/2017   GLUCOSE 79 11/29/2013   CHOL 134 11/29/2013   TRIG 53.0 11/29/2013   HDL 57.30 11/29/2013   LDLCALC 66 11/29/2013   ALT 15 11/29/2013   AST 20 11/29/2013   NA 139 11/29/2013   K 3.7 11/29/2013   CL 106 11/29/2013   CREATININE 0.7 11/29/2013   BUN 13 11/29/2013   CO2 26 11/29/2013   TSH 2.450 10/17/2017    MM 3D SCREEN BREAST W/IMPLANT BILATERAL  Result Date:  03/31/2018 CLINICAL DATA:  Screening. EXAM: DIGITAL SCREENING BILATERAL MAMMOGRAM WITH IMPLANTS, CAD AND TOMO The patient has retropectoral implants. Standard and implant displaced views were performed. COMPARISON:  Previous exam(s). ACR Breast Density Category b: There are scattered areas of fibroglandular density. FINDINGS: There are no findings suspicious for malignancy. Images were processed with CAD. IMPRESSION: No mammographic evidence of malignancy. A result letter of this screening mammogram will be mailed directly to the patient. RECOMMENDATION: Screening mammogram in one year. (Code:SM-B-01Y) BI-RADS CATEGORY  1:  Negative. Electronically Signed   By: Franki Cabot M.D.   On: 03/31/2018 13:41     Assessment & Plan:  Plan  I have discontinued [name]. [name]. I am also having her start on doxycycline. Additionally, I am having her maintain her amphetamine-dextroamphetamine.  Meds ordered this encounter  Medications  . doxycycline (VIBRA-TABS) 100 MG tablet    Sig: Take 1 tablet (100 mg total) by mouth 2 (two) times daily.    Dispense:  20 tablet    Refill:  0    Problem List Items Addressed This Visit    None    Visit Diagnoses    Lymphadenopathy of head and neck    -  Primary   Relevant Medications   doxycycline (VIBRA-TABS) 100 MG tablet   Other Relevant Orders   US SOFT TISSUE HEAD & NECK (NON-THYROID)   CBC with Differential/Platelet      Follow-up: Return in about 2 weeks (around 11/04/2020), or if symptoms worsen or fail to improve, for f/u adenopathy.  Ann Held, DO

## 2020-10-21 NOTE — Patient Instructions (Signed)

## 2020-10-22 LAB — CBC WITH DIFFERENTIAL/PLATELET
Basophils Absolute: 0.1 10*3/uL (ref 0.0–0.1)
Basophils Relative: 0.9 % (ref 0.0–3.0)
Eosinophils Absolute: 0.1 10*3/uL (ref 0.0–0.7)
Eosinophils Relative: 0.9 % (ref 0.0–5.0)
HCT: 37.7 % (ref 36.0–46.0)
Hemoglobin: 12.9 g/dL (ref 12.0–15.0)
Lymphocytes Relative: 26.5 % (ref 12.0–46.0)
Lymphs Abs: 1.9 10*3/uL (ref 0.7–4.0)
MCHC: 34.2 g/dL (ref 30.0–36.0)
MCV: 98.4 fl (ref 78.0–100.0)
Monocytes Absolute: 0.5 10*3/uL (ref 0.1–1.0)
Monocytes Relative: 7.3 % (ref 3.0–12.0)
Neutro Abs: 4.5 10*3/uL (ref 1.4–7.7)
Neutrophils Relative %: 64.4 % (ref 43.0–77.0)
Platelets: 251 10*3/uL (ref 150.0–400.0)
RBC: 3.84 Mil/uL — ABNORMAL LOW (ref 3.87–5.11)
RDW: 13.1 % (ref 11.5–15.5)
WBC: 7 10*3/uL (ref 4.0–10.5)

## 2020-10-23 ENCOUNTER — Ambulatory Visit (HOSPITAL_BASED_OUTPATIENT_CLINIC_OR_DEPARTMENT_OTHER): Payer: BC Managed Care – PPO

## 2020-11-10 ENCOUNTER — Other Ambulatory Visit: Payer: Self-pay | Admitting: Family Medicine

## 2020-11-10 DIAGNOSIS — F988 Other specified behavioral and emotional disorders with onset usually occurring in childhood and adolescence: Secondary | ICD-10-CM

## 2020-11-11 ENCOUNTER — Other Ambulatory Visit: Payer: Self-pay | Admitting: Family Medicine

## 2020-11-11 MED ORDER — AMPHETAMINE-DEXTROAMPHET ER 20 MG PO CP24: 20.0000 mg | ORAL_CAPSULE | Freq: Every morning | ORAL | 0 refills | Status: DC

## 2020-11-11 MED FILL — ADDERALL XR 20 MG CAP SA: 20 | 30 days supply | Qty: 30 | Fill #0

## 2020-11-11 NOTE — Telephone Encounter (Signed)
Requesting: Adderall XR 20mg  Contract: 04/07/2018 UDS: 05/08/2020 Last Visit: 10/21/2020 Next Visit: None scheduled  Last Refill: 10/10/2020 #30 and 0RF  Please Advise

## 2020-11-11 NOTE — Telephone Encounter (Signed)
Last written: 10/10/20 Last ov: 10/21/20 Next ov: none Contract: 04/07/18 UDS: 05/08/20

## 2020-11-19 DIAGNOSIS — Z20822 Contact with and (suspected) exposure to covid-19: Secondary | ICD-10-CM | POA: Diagnosis not present

## 2020-11-24 ENCOUNTER — Telehealth (INDEPENDENT_AMBULATORY_CARE_PROVIDER_SITE_OTHER): Payer: BC Managed Care – PPO | Admitting: Family Medicine

## 2020-11-24 ENCOUNTER — Encounter: Payer: Self-pay | Admitting: Family Medicine

## 2020-11-24 ENCOUNTER — Other Ambulatory Visit: Payer: Self-pay

## 2020-11-24 DIAGNOSIS — J0141 Acute recurrent pansinusitis: Secondary | ICD-10-CM | POA: Diagnosis not present

## 2020-11-24 DIAGNOSIS — J40 Bronchitis, not specified as acute or chronic: Secondary | ICD-10-CM | POA: Diagnosis not present

## 2020-11-24 DIAGNOSIS — R591 Generalized enlarged lymph nodes: Secondary | ICD-10-CM

## 2020-11-24 MED ORDER — FLUTICASONE PROPIONATE 50 MCG/ACT NA SUSP: 2.0000 | Freq: Every day | NASAL | 6 refills | Status: DC

## 2020-11-24 MED ORDER — DOXYCYCLINE HYCLATE 100 MG PO TABS: 100.0000 mg | ORAL_TABLET | Freq: Two times a day (BID) | ORAL | 0 refills | Status: DC

## 2020-11-24 NOTE — Progress Notes (Signed)
Virtual Visit via Video Note  I connected with Nicole Drake on 11/24/20 at  2:40 PM EST by a video enabled telemedicine application and verified that I am speaking with the correct person using two identifiers.  Location: Patient: home with husband  Provider: office    I discussed the limitations of evaluation and management by telemedicine and the availability of in person appointments. The patient expressed understanding and agreed to proceed.  History of Present Illness: Pt is home c/o sore throat , pnd , and chest congestion , _+ fever on and offf pt is taking mucinex with some help  No cough    Observations/Objective: There were no vitals filed for this visit. covid test neg  Pt is in nad No sob Assessment and Plan:  1 Acute pansinusitis abx and flonase Call or rto prn  - fluticasone (FLONASE) 50 MCG/ACT nasal spray; Place 2 sprays into both nostrils daily.  Dispense: 16 g; Refill: 6  2. Bronchitis abx and flonase rto prn  - fluticasone (FLONASE) 50 MCG/ACT nasal spray; Place 2 sprays into both nostrils daily.  Dispense: 16 g; Refill: 6  Follow Up Instructions:    I discussed the assessment and treatment plan with the patient. The patient was provided an opportunity to ask questions and all were answered. The patient agreed with the plan and demonstrated an understanding of the instructions.   The patient was advised to call back or seek an in-person evaluation if the symptoms worsen or if the condition fails to improve as anticipated.  I provided 25 minutes of non-face-to-face time during this encounter.   Ann Held, DO

## 2020-12-15 ENCOUNTER — Other Ambulatory Visit: Payer: Self-pay | Admitting: Family Medicine

## 2020-12-15 DIAGNOSIS — F988 Other specified behavioral and emotional disorders with onset usually occurring in childhood and adolescence: Secondary | ICD-10-CM

## 2020-12-15 MED ORDER — AMPHETAMINE-DEXTROAMPHET ER 20 MG PO CP24: 20.0000 mg | ORAL_CAPSULE | Freq: Every morning | ORAL | 0 refills | Status: DC

## 2020-12-15 MED FILL — AMPHETAMINE-DEXTROAMPHET ER: 20 | 30 days supply | Qty: 30 | Fill #0

## 2020-12-15 NOTE — Telephone Encounter (Signed)
Requesting: Adderall XR 20mg  Contract: 04/07/2018 UDS: 05/08/2020 Last Visit: 10/21/2020 Next Visit: None Last Refill:  11/11/2020 #30 and 0RF (x2)  Please Advise

## 2020-12-17 ENCOUNTER — Other Ambulatory Visit: Payer: Self-pay

## 2020-12-17 ENCOUNTER — Ambulatory Visit: Payer: BC Managed Care – PPO | Admitting: Family Medicine

## 2020-12-17 ENCOUNTER — Other Ambulatory Visit: Payer: Self-pay | Admitting: Family Medicine

## 2020-12-17 VITALS — BP 126/80 | HR 88 | Resp 17 | Ht 63.0 in | Wt 131.0 lb

## 2020-12-17 DIAGNOSIS — M5442 Lumbago with sciatica, left side: Secondary | ICD-10-CM

## 2020-12-17 DIAGNOSIS — F988 Other specified behavioral and emotional disorders with onset usually occurring in childhood and adolescence: Secondary | ICD-10-CM | POA: Diagnosis not present

## 2020-12-17 MED ORDER — PREDNISONE 20 MG PO TABS: ORAL_TABLET | ORAL | 0 refills | Status: DC

## 2020-12-17 MED ORDER — AMPHETAMINE-DEXTROAMPHET ER 20 MG PO CP24: 20.0000 mg | ORAL_CAPSULE | Freq: Every morning | ORAL | 0 refills | Status: DC

## 2020-12-17 MED ORDER — HYDROCODONE-ACETAMINOPHEN 5-325 MG PO TABS: 1.0000 | ORAL_TABLET | Freq: Four times a day (QID) | ORAL | 0 refills | Status: DC | PRN

## 2020-12-17 MED ORDER — CYCLOBENZAPRINE HCL 10 MG PO TABS: 10.0000 mg | ORAL_TABLET | Freq: Two times a day (BID) | ORAL | 0 refills | Status: DC | PRN

## 2020-12-17 MED FILL — HYDROCODON-APAP 5-325: 5-325 | 3 days supply | Qty: 20 | Fill #0

## 2020-12-17 MED FILL — CYCLOBENZAPRINE HCL 10 MG T: 10 | 15 days supply | Qty: 30 | Fill #0

## 2020-12-17 MED FILL — predniSONE 20 MG TABS: 20 | 8 days supply | Qty: 12 | Fill #0

## 2020-12-17 NOTE — Progress Notes (Signed)
[facility] at [facility] [place], [place], [place] [address] [phone]  Date:  12/17/2020   Name:  Nicole Drake   DOB:  04-20-1976   MRN:  [id number]  PCP:  Ann Held, DO    Chief Complaint: Back Pain (Started 3 days ago, no known injury, worsens throughout the day, lower back pain, trouble walking)   History of Present Illness:  Nicole Drake is a 45 y.o. very pleasant female patient who presents with the following:  Pt awoke with back pain about 3 days ago She did lift some furniture about 3 weeks ago but is not aware of any injury and did not have any pain at the time She has difficulty getting in and out of a chair, and cannot bend over such as to tie her shoes, etc. Sleeping is also very hard- she cannot get comfortable  The pain started running down her left buttock and upper leg just today   The left leg is a bit numb today but no weakness noted She tried a muscle relaxer which did help a bit - left over from her husband - they are not sure what it is and it was from 6 years ago   No issues with bowel or bladder control   She is generally in good health No risk of current pregnancy Besides her back she feels fine today No urinary symptoms  Patient Active Problem List   Diagnosis Date Noted  . Seasonal allergies 03/28/2019  . ADD (attention deficit disorder) 09/16/2015    No past medical history on file.  Past Surgical History:  Procedure Laterality Date  . AUGMENTATION MAMMAPLASTY    . CERVIX SURGERY     partial remove/age 48  . OVARIAN CYST REMOVAL     age 64, 4, 1  . TONSILLECTOMY     age 7    Social History   Tobacco Use  . Smoking status: Former Smoker    Packs/day: 0.25    Years: 18.00    Pack years: 4.50    Types: Cigarettes    Quit date: 08/23/2016    Years since quitting: 4.3  . Smokeless tobacco: Never Used  Substance Use Topics  . Alcohol use: Yes    Comment: OCC   . Drug use: No    Family History  Problem Relation Age of Onset  . Heart disease Father 77       MI  . Heart disease Maternal Grandfather 80       MI  . Heart disease Paternal Grandfather 62       MI    Allergies  Allergen Reactions  . Penicillins Rash    Medication list has been reviewed and updated.  Current Outpatient Medications on File Prior to Visit  Medication Sig Dispense Refill  . amphetamine-dextroamphetamine (ADDERALL XR) 20 MG 24 hr capsule Take 1 capsule (20 mg total) by mouth every morning. 30 capsule 0  . amphetamine-dextroamphetamine (ADDERALL XR) 20 MG 24 hr capsule Take 1 capsule (20 mg total) by mouth every morning. 30 capsule 0   No current facility-administered medications on file prior to visit.    Review of Systems:  As per HPI- otherwise negative.   Physical Examination: Vitals:   12/17/20 1110  BP: 126/80  Pulse: 88  Resp: 17  SpO2: 99%   Vitals:   12/17/20 1110  Weight: 131 lb (59.4 kg)  Height: 5\' 3"  (  1.6 m)   Body mass index is 23.21 kg/m. Ideal Body Weight: Weight in (lb) to have BMI = 25: 140.8  GEN: no acute distress. Normal weight, looks well but in some pain from her back  HEENT: Atraumatic, Normocephalic.  Ears and Nose: No external deformity. CV: RRR, No M/G/R. No JVD. No thrill. No extra heart sounds. PULM: CTA B, no wheezes, crackles, rhonchi. No retractions. No resp. distress. No accessory muscle use. EXTR: No c/c/e PSYCH: Normally interactive. Conversant.  Quite positive SLR on the left only Negative SLR on the right Extremely limited for flexion due to back pain.  Thoracolumbar extension slightly reduced Normal strength, sensation, DTR both lower extremities  Assessment and Plan: Acute bilateral low back pain with left-sided sciatica - Plan: predniSONE (DELTASONE) 20 MG tablet, cyclobenzaprine (FLEXERIL) 10 MG tablet, HYDROcodone-acetaminophen (NORCO/VICODIN) 5-325 MG tablet, DISCONTINUED:  HYDROcodone-acetaminophen (NORCO/VICODIN) 5-325 MG tablet  Attention deficit disorder (ADD) without hyperactivity - Plan: DISCONTINUED: amphetamine-dextroamphetamine (ADDERALL XR) 20 MG 24 hr capsule   Patient here today with lower back pain for about 3 days, now with some radiation to the left buttock and upper leg.  Positive straight leg raise test on the left, suspect bulging disc with nerve impingement  We discussed doing x-rays.  The patient declines for the time being, we can go back and do this later if symptoms persist We will treat with prednisone for 8 days Pain medication, muscle relaxer as needed Discussed possible sedation with these medications, when used in conjunction unless [place] necessary.  I have asked her to let me know if not significantly improved in the next few days, sooner if getting worse.  If symptoms persist we can obtain plain films and then proceed to MRI if necessary.  COVID-19 booster not done yet, encouraged her to do this as soon as possible This visit occurred during the SARS-CoV-2 public health emergency.  Safety protocols were in place, including screening questions prior to the visit, additional usage of staff PPE, and extensive cleaning of exam room while observing appropriate contact time as indicated for disinfecting solutions.      Signed Abbe Amsterdam, MD

## 2020-12-17 NOTE — Patient Instructions (Signed)
It was good to see you today but I am sorry you are hurting!  I suspect you have a bulging disc in your lower back which is pressing on a nerve  Prednisone for 8 days Flexeril (muscle relaxer) as needed Hydrocodone (vicodin) as needed  Heat/ cool as preferred  Please let me know if you are not feeling better in the next few dasy Both flexeril and hydrocodone can make you sleepy!  Use caution and avoid using together unless necessary

## 2021-01-14 ENCOUNTER — Other Ambulatory Visit: Payer: Self-pay | Admitting: Family Medicine

## 2021-01-14 DIAGNOSIS — F988 Other specified behavioral and emotional disorders with onset usually occurring in childhood and adolescence: Secondary | ICD-10-CM

## 2021-01-15 ENCOUNTER — Other Ambulatory Visit: Payer: Self-pay | Admitting: Family Medicine

## 2021-01-15 MED ORDER — AMPHETAMINE-DEXTROAMPHET ER 20 MG PO CP24: 20.0000 mg | ORAL_CAPSULE | Freq: Every morning | ORAL | 0 refills | Status: DC

## 2021-01-15 MED FILL — AMPHETAMINE-DEXTROAMPHET ER: 20 | 30 days supply | Qty: 30 | Fill #0

## 2021-01-15 NOTE — Telephone Encounter (Signed)
Requesting: Adderall XR Contract: 04/07/18 UDS: 05/08/20 Last Visit: 12/17/20 Next Visit: none Last Refill: 12/15/20  Please Advise

## 2021-02-16 ENCOUNTER — Other Ambulatory Visit: Payer: Self-pay | Admitting: Family Medicine

## 2021-02-16 DIAGNOSIS — F988 Other specified behavioral and emotional disorders with onset usually occurring in childhood and adolescence: Secondary | ICD-10-CM

## 2021-02-16 MED ORDER — AMPHETAMINE-DEXTROAMPHET ER 20 MG PO CP24
20.0000 mg | ORAL_CAPSULE | Freq: Every morning | ORAL | 0 refills | Status: DC
Start: 2021-02-16 — End: 2021-02-16

## 2021-02-16 MED FILL — AMPHETAMINE-DEXTROAMPHET ER: 20 | 30 days supply | Qty: 30 | Fill #0

## 2021-02-16 NOTE — Telephone Encounter (Signed)
Will refill x1 but pt needs add f/u and uds/ contract

## 2021-02-16 NOTE — Telephone Encounter (Signed)
Requesting: Adderall XR 20mg  Contract: 04/07/2018 UDS: 05/08/2020 Last Visit: 12/17/2020 w/ [place] Next Visit: None Last Refill: 01/15/2021 #30 and 9JK  Please Advise

## 2021-02-18 NOTE — Telephone Encounter (Signed)
Per Etter Sjogren, pt needs OV w/ UDS please

## 2021-02-20 ENCOUNTER — Encounter: Payer: Self-pay | Admitting: Family Medicine

## 2021-02-20 ENCOUNTER — Ambulatory Visit: Payer: BC Managed Care – PPO | Admitting: Family Medicine

## 2021-02-20 ENCOUNTER — Other Ambulatory Visit: Payer: Self-pay

## 2021-02-20 VITALS — BP 102/80 | HR 110 | Temp 98.7°F | Resp 16 | Ht 63.0 in | Wt 142.6 lb

## 2021-02-20 DIAGNOSIS — E663 Overweight: Secondary | ICD-10-CM | POA: Diagnosis not present

## 2021-02-20 DIAGNOSIS — Z833 Family history of diabetes mellitus: Secondary | ICD-10-CM | POA: Diagnosis not present

## 2021-02-20 DIAGNOSIS — F988 Other specified behavioral and emotional disorders with onset usually occurring in childhood and adolescence: Secondary | ICD-10-CM | POA: Diagnosis not present

## 2021-02-20 NOTE — Progress Notes (Signed)
Patient ID: Nicole Drake, female    DOB: 03-Apr-1976  Age: 45 y.o. MRN: [id number]    Subjective:  Subjective  HPI Nicole Drake presents for office visit today. She complains of having unexpected weight gain. She reports gaining 8 lbs in one weekend which is not her baseline. She has made diet changes but still has not seen results. Her BP was 102/80 in the office. She states that she is tolerating her 20 mg  amphetamine-dextroamphetamine daily P.O. Denies having any side effects.   She has an IUD OCP secondary to her menstrual cycle lasting for long periods of time (3 months).  She denies any chest pain, back pain,fever, chills, HA, sore throat, fatigue, abdominal pain, back pain, vaginal pain, or urinary pain at this time.   Review of Systems  Constitutional: Positive for unexpected weight change (gain). Negative for chills, fatigue and fever.  HENT: Negative for congestion, hearing loss, rhinorrhea, sinus pressure, sinus pain and sore throat.   Eyes: Negative for pain and discharge.  Respiratory: Negative for cough, chest tightness and shortness of breath.   Cardiovascular: Negative for chest pain, palpitations and leg swelling.  Gastrointestinal: Negative for abdominal pain, blood in stool, constipation, diarrhea, nausea and vomiting.  Genitourinary: Negative for difficulty urinating, dysuria, flank pain, frequency, hematuria and urgency.  Musculoskeletal: Negative for back pain, myalgias and neck pain.  Skin: Negative for rash.  Allergic/Immunologic: Negative for environmental allergies.  Neurological: Negative for dizziness, weakness and headaches.  Hematological: Does not bruise/bleed easily.  Psychiatric/Behavioral: Negative for suicidal ideas. The patient is not nervous/anxious.     History No past medical history on file.  She has a past surgical history that includes Tonsillectomy; Ovarian cyst removal; Cervix surgery; and Augmentation mammaplasty.   Her family history  includes Heart disease (age of onset: 88) in her paternal grandfather; Heart disease (age of onset: 37) in her father; Heart disease (age of onset: 65) in her maternal grandfather.She reports that she quit smoking about 4 years ago. Her smoking use included cigarettes. She has a 4.50 pack-year smoking history. She has never used smokeless tobacco. She reports current alcohol use. She reports that she does not use drugs.  Current Outpatient Medications on File Prior to Visit  Medication Sig Dispense Refill  . amphetamine-dextroamphetamine (ADDERALL XR) 20 MG 24 hr capsule Take 1 capsule (20 mg total) by mouth every morning. 30 capsule 0   No current facility-administered medications on file prior to visit.     Objective:  Objective  Physical Exam Vitals and nursing note reviewed.  Constitutional:      General: She is not in acute distress.    Appearance: Normal appearance. She is well-developed. She is not ill-appearing.  HENT:     Head: Normocephalic and atraumatic.     Right Ear: External ear normal.     Left Ear: External ear normal.     Nose: Nose normal.  Eyes:     General:        Right eye: No discharge.        Left eye: No discharge.     Extraocular Movements: Extraocular movements intact.     Pupils: Pupils are equal, round, and reactive to light.  Cardiovascular:     Rate and Rhythm: Normal rate and regular rhythm.     Pulses: Normal pulses.     Heart sounds: Normal heart sounds. No murmur heard.   Pulmonary:     Effort: Pulmonary effort is normal. No respiratory distress.  Breath sounds: Normal breath sounds. No wheezing, rhonchi or rales.  Abdominal:     General: Bowel sounds are normal.     Palpations: Abdomen is soft.     Tenderness: There is no abdominal tenderness.  Musculoskeletal:     Cervical back: Normal range of motion and neck supple.  Skin:    General: Skin is warm and dry.  Neurological:     Mental Status: She is alert and oriented to person,  place, and time.  Psychiatric:        Behavior: Behavior normal.    BP 102/80 (BP Location: Right Arm, Patient Position: Sitting, Cuff Size: Normal)   Pulse (!) 110   Temp 98.7 F (37.1 C) (Oral)   Resp 16   Ht 5\' 3"  (1.6 m)   Wt 142 lb 9.6 oz (64.7 kg)   SpO2 98%   BMI 25.26 kg/m  Wt Readings from Last 3 Encounters:  02/20/21 142 lb 9.6 oz (64.7 kg)  12/17/20 131 lb (59.4 kg)  10/21/20 131 lb 12.8 oz (59.8 kg)     Lab Results  Component Value Date   WBC 7.0 10/21/2020   HGB 12.9 10/21/2020   HCT 37.7 10/21/2020   PLT 251.0 10/21/2020   GLUCOSE 79 11/29/2013   CHOL 134 11/29/2013   TRIG 53.0 11/29/2013   HDL 57.30 11/29/2013   LDLCALC 66 11/29/2013   ALT 15 11/29/2013   AST 20 11/29/2013   NA 139 11/29/2013   K 3.7 11/29/2013   CL 106 11/29/2013   CREATININE 0.7 11/29/2013   BUN 13 11/29/2013   CO2 26 11/29/2013   TSH 2.450 10/17/2017    MM 3D SCREEN BREAST W/IMPLANT BILATERAL  Result Date: 03/31/2018 CLINICAL DATA:  Screening. EXAM: DIGITAL SCREENING BILATERAL MAMMOGRAM WITH IMPLANTS, CAD AND TOMO The patient has retropectoral implants. Standard and implant displaced views were performed. COMPARISON:  Previous exam(s). ACR Breast Density Category b: There are scattered areas of fibroglandular density. FINDINGS: There are no findings suspicious for malignancy. Images were processed with CAD. IMPRESSION: No mammographic evidence of malignancy. A result letter of this screening mammogram will be mailed directly to the patient. RECOMMENDATION: Screening mammogram in one year. (Code:SM-B-01Y) BI-RADS CATEGORY  1:  Negative. Electronically Signed   By: Franki Cabot M.D.   On: 03/31/2018 13:41     Assessment & Plan:  Plan   No orders of the defined types were placed in this encounter.   Problem List Items Addressed This Visit      Unprioritized   ADD (attention deficit disorder) - Primary    Doing well with adderall xr uds and contract updated        Other  Visit Diagnoses    Family history of diabetes mellitus       Relevant Orders   TSH   Vitamin B12   CBC with Differential/Platelet   Comprehensive metabolic panel   Insulin, random   Hemoglobin A1c   Overweight       Relevant Orders   TSH   Vitamin B12   CBC with Differential/Platelet   Comprehensive metabolic panel   Insulin, random   Hemoglobin A1c      Follow-up: Return in about 6 months (around 08/23/2021), or if symptoms worsen or fail to improve, for annual exam, fasting.   I,Alexis Bryant,acting as a Education administrator for [employer], DO.,have documented all relevant documentation on the behalf of Ann Held, DO,as directed by  Ann Held, DO  while in the presence of [place], DO, have reviewed all documentation for this visit. The documentation on 02/20/21 for the exam, diagnosis, procedures, and orders are all accurate and complete.

## 2021-02-20 NOTE — Addendum Note (Signed)
Addended by: Kem Boroughs D on: 02/20/2021 04:56 PM   Modules accepted: Orders

## 2021-02-20 NOTE — Patient Instructions (Signed)

## 2021-02-20 NOTE — Assessment & Plan Note (Signed)
Doing well with adderall xr uds and contract updated

## 2021-02-23 LAB — CBC WITH DIFFERENTIAL/PLATELET
Absolute Monocytes: 562 cells/uL (ref 200–950)
Basophils Absolute: 53 cells/uL (ref 0–200)
Basophils Relative: 0.7 %
Eosinophils Absolute: 61 cells/uL (ref 15–500)
Eosinophils Relative: 0.8 %
HCT: 39.8 % (ref 35.0–45.0)
Hemoglobin: 13.6 g/dL (ref 11.7–15.5)
Lymphs Abs: 2166 cells/uL (ref 850–3900)
MCH: 33 pg (ref 27.0–33.0)
MCHC: 34.2 g/dL (ref 32.0–36.0)
MCV: 96.6 fL (ref 80.0–100.0)
MPV: 10.5 fL (ref 7.5–12.5)
Monocytes Relative: 7.4 %
Neutro Abs: 4758 cells/uL (ref 1500–7800)
Neutrophils Relative %: 62.6 %
Platelets: 307 10*3/uL (ref 140–400)
RBC: 4.12 10*6/uL (ref 3.80–5.10)
RDW: 11.9 % (ref 11.0–15.0)
Total Lymphocyte: 28.5 %
WBC: 7.6 10*3/uL (ref 3.8–10.8)

## 2021-02-23 LAB — INSULIN, RANDOM: Insulin: 3.9 u[IU]/mL

## 2021-02-23 LAB — HEMOGLOBIN A1C
Hgb A1c MFr Bld: 4.6 % of total Hgb (ref <5.7)
Mean Plasma Glucose: 85 mg/dL
eAG (mmol/L): 4.7 mmol/L

## 2021-02-23 LAB — COMPREHENSIVE METABOLIC PANEL
AG Ratio: 1.8 (calc) (ref 1.0–2.5)
ALT: 18 U/L (ref 6–29)
AST: 19 U/L (ref 10–35)
Albumin: 4.6 g/dL (ref 3.6–5.1)
Alkaline phosphatase (APISO): 36 U/L (ref 31–125)
BUN: 14 mg/dL (ref 7–25)
CO2: 25 mmol/L (ref 20–32)
Calcium: 9.2 mg/dL (ref 8.6–10.2)
Chloride: 101 mmol/L (ref 98–110)
Creat: 0.73 mg/dL (ref 0.50–1.10)
Globulin: 2.5 g/dL (calc) (ref 1.9–3.7)
Glucose, Bld: 77 mg/dL (ref 65–99)
Potassium: 3.9 mmol/L (ref 3.5–5.3)
Sodium: 137 mmol/L (ref 135–146)
Total Bilirubin: 0.4 mg/dL (ref 0.2–1.2)
Total Protein: 7.1 g/dL (ref 6.1–8.1)

## 2021-02-23 LAB — VITAMIN B12: Vitamin B-12: 369 pg/mL (ref 200–1100)

## 2021-02-23 LAB — TSH: TSH: 2.11 mIU/L

## 2021-02-25 LAB — DRUG MONITORING, PANEL 8 WITH CONFIRMATION, URINE
6 Acetylmorphine: NEGATIVE ng/mL (ref <10)
Alcohol Metabolites: POSITIVE ng/mL — AB
Amphetamine: 1195 ng/mL — ABNORMAL HIGH (ref <250)
Amphetamines: POSITIVE ng/mL — AB (ref <500)
Benzodiazepines: NEGATIVE ng/mL (ref <100)
Buprenorphine, Urine: NEGATIVE ng/mL (ref <5)
Cocaine Metabolite: NEGATIVE ng/mL (ref <150)
Creatinine: 25.1 mg/dL
Ethyl Glucuronide (ETG): 41680 ng/mL — ABNORMAL HIGH (ref <500)
Ethyl Sulfate (ETS): 7177 ng/mL — ABNORMAL HIGH (ref <100)
MDMA: NEGATIVE ng/mL (ref <500)
Marijuana Metabolite: NEGATIVE ng/mL (ref <20)
Methamphetamine: NEGATIVE ng/mL (ref <250)
Opiates: NEGATIVE ng/mL (ref <100)
Oxidant: NEGATIVE ug/mL
Oxycodone: NEGATIVE ng/mL (ref <100)
pH: 6.9 (ref 4.5–9.0)

## 2021-02-25 LAB — DM TEMPLATE

## 2021-03-20 ENCOUNTER — Other Ambulatory Visit (HOSPITAL_BASED_OUTPATIENT_CLINIC_OR_DEPARTMENT_OTHER): Payer: Self-pay

## 2021-03-20 ENCOUNTER — Other Ambulatory Visit: Payer: Self-pay | Admitting: Family Medicine

## 2021-03-20 DIAGNOSIS — F988 Other specified behavioral and emotional disorders with onset usually occurring in childhood and adolescence: Secondary | ICD-10-CM

## 2021-03-20 MED ORDER — AMPHETAMINE-DEXTROAMPHET ER 20 MG PO CP24
ORAL_CAPSULE | Freq: Every morning | ORAL | 0 refills | Status: DC
  Filled 2021-03-20: qty 30, 30d supply, fill #0

## 2021-03-20 NOTE — Telephone Encounter (Signed)
Requesting: adderall Contract:03/05/2021 UDS:02/20/2021 Last Visit:02/20/2021 Next Visit:n/a Last Refill:02/16/2021  Please Advise

## 2021-04-23 ENCOUNTER — Other Ambulatory Visit: Payer: Self-pay | Admitting: Family Medicine

## 2021-04-23 ENCOUNTER — Other Ambulatory Visit (HOSPITAL_BASED_OUTPATIENT_CLINIC_OR_DEPARTMENT_OTHER): Payer: Self-pay

## 2021-04-23 DIAGNOSIS — F988 Other specified behavioral and emotional disorders with onset usually occurring in childhood and adolescence: Secondary | ICD-10-CM

## 2021-04-23 MED ORDER — AMPHETAMINE-DEXTROAMPHET ER 20 MG PO CP24
ORAL_CAPSULE | Freq: Every morning | ORAL | 0 refills | Status: DC
  Filled 2021-04-23: qty 30, 30d supply, fill #0

## 2021-04-23 NOTE — Telephone Encounter (Signed)
Requesting: Adderral XR Contract: 05/08/2020 UDS: 05/08/2020 Last OV: 02/20/2021 Next OV: N/A  Last Refill: 03/20/2021, #30--0 RF Database:   Please advise

## 2021-05-25 ENCOUNTER — Other Ambulatory Visit: Payer: Self-pay | Admitting: Family Medicine

## 2021-05-25 ENCOUNTER — Other Ambulatory Visit (HOSPITAL_BASED_OUTPATIENT_CLINIC_OR_DEPARTMENT_OTHER): Payer: Self-pay | Admitting: Family Medicine

## 2021-05-25 ENCOUNTER — Other Ambulatory Visit (HOSPITAL_BASED_OUTPATIENT_CLINIC_OR_DEPARTMENT_OTHER): Payer: Self-pay

## 2021-05-25 ENCOUNTER — Telehealth: Payer: Self-pay | Admitting: Family Medicine

## 2021-05-25 DIAGNOSIS — F988 Other specified behavioral and emotional disorders with onset usually occurring in childhood and adolescence: Secondary | ICD-10-CM

## 2021-05-25 DIAGNOSIS — Z1231 Encounter for screening mammogram for malignant neoplasm of breast: Secondary | ICD-10-CM

## 2021-05-25 MED ORDER — AMPHETAMINE-DEXTROAMPHET ER 20 MG PO CP24
ORAL_CAPSULE | Freq: Every morning | ORAL | 0 refills | Status: DC
  Filled 2021-05-25: qty 30, 30d supply, fill #0

## 2021-05-25 NOTE — Telephone Encounter (Signed)
Medication: amphetamine-dextroamphetamine (ADDERALL XR) 20 MG 24 hr capsule [[id number]]     Has the patient contacted their pharmacy? no (If no, request that the patient contact the pharmacy for the refill.) (If yes, when and what did the pharmacy advise?)    Preferred Pharmacy (with phone number or street name):[facility]  [address] [place], [place] [address]  Phone:  [phone]  Fax:  [phone]     Agent: Please be advised that RX refills may take up to 3 business days. We ask that you follow-up with your pharmacy.

## 2021-05-25 NOTE — Telephone Encounter (Signed)
Requesting: Adderall XR Contract: 02/20/21 UDS: 02/20/21 Last OV: 02/20/21 Next OV: N/A Last Refill: 04/23/21, #30--0 RF Database:   Please advise

## 2021-05-26 ENCOUNTER — Other Ambulatory Visit: Payer: Self-pay

## 2021-05-26 ENCOUNTER — Encounter (HOSPITAL_BASED_OUTPATIENT_CLINIC_OR_DEPARTMENT_OTHER): Payer: Self-pay

## 2021-05-26 ENCOUNTER — Ambulatory Visit (HOSPITAL_BASED_OUTPATIENT_CLINIC_OR_DEPARTMENT_OTHER)
Admission: RE | Admit: 2021-05-26 | Discharge: 2021-05-26 | Disposition: A | Discharge status: Home / Self Care | Payer: BC Managed Care – PPO | Source: Ambulatory Visit | Attending: Family Medicine | Admitting: Family Medicine

## 2021-05-26 DIAGNOSIS — Z1231 Encounter for screening mammogram for malignant neoplasm of breast: Secondary | ICD-10-CM | POA: Diagnosis not present

## 2021-05-28 ENCOUNTER — Ambulatory Visit: Payer: BC Managed Care – PPO | Admitting: Family Medicine

## 2021-05-28 ENCOUNTER — Other Ambulatory Visit: Payer: Self-pay

## 2021-05-28 ENCOUNTER — Other Ambulatory Visit (HOSPITAL_BASED_OUTPATIENT_CLINIC_OR_DEPARTMENT_OTHER): Payer: Self-pay

## 2021-05-28 VITALS — BP 98/70 | HR 126 | Temp 98.6°F | Resp 18 | Ht 63.0 in | Wt 137.2 lb

## 2021-05-28 DIAGNOSIS — R350 Frequency of micturition: Secondary | ICD-10-CM | POA: Diagnosis not present

## 2021-05-28 LAB — POC URINALSYSI DIPSTICK (AUTOMATED)
Bilirubin, UA: NEGATIVE
Blood, UA: NEGATIVE
Glucose, UA: NEGATIVE
Leukocytes, UA: NEGATIVE
Nitrite, UA: NEGATIVE
Protein, UA: NEGATIVE
Spec Grav, UA: 1.015 (ref 1.010–1.025)
Urobilinogen, UA: 0.2 E.U./dL
pH, UA: 6 (ref 5.0–8.0)

## 2021-05-28 MED ORDER — NITROFURANTOIN MONOHYD MACRO 100 MG PO CAPS
100.0000 mg | ORAL_CAPSULE | Freq: Two times a day (BID) | ORAL | 0 refills | Status: DC
  Filled 2021-05-28: qty 14, 7d supply, fill #0

## 2021-05-28 NOTE — Patient Instructions (Signed)
Urinary Frequency, Adult Urinary frequency means urinating more often than usual. You may urinate every 1-2 hours even though you drink a normal amount of fluid and do not have a bladder infection or condition. Although you urinate more often than normal,the total amount of urine produced in a day is normal. With urinary frequency, you may have an urgent need to urinate often. The stress and anxiety of needing to find a bathroom quickly can make this urge worse. This condition may go away on its own or you may need treatment at home. Home treatment may include bladder training, exercises, taking medicines, ormaking changes to your diet. Follow these instructions at home: Bladder health  Keep a bladder diary if told by your health care provider. Keep track of: What you eat and drink. How often you urinate. How much you urinate. Follow a bladder training program if told by your health care provider. This may include: Learning to delay going to the bathroom. Double urinating (voiding). This helps if you are not completely emptying your bladder. Scheduled voiding. Do Kegel exercises as told by your health care provider. Kegel exercises strengthen the muscles that help control urination, which may help the condition.  Eating and drinking If told by your health care provider, make diet changes, such as: Avoiding caffeine. Drinking fewer fluids, especially alcohol. Not drinking in the evening. Avoiding foods or drinks that may irritate the bladder. These include coffee, tea, soda, artificial sweeteners, citrus, tomato-based foods, and chocolate. Eating foods that help prevent or ease constipation. Constipation can make this condition worse. Your health care provider may recommend that you: Drink enough fluid to keep your urine pale yellow. Take over-the-counter or prescription medicines. Eat foods that are high in fiber, such as beans, whole grains, and fresh fruits and vegetables. Limit foods  that are high in fat and processed sugars, such as fried or sweet foods. General instructions Take over-the-counter and prescription medicines only as told by your health care provider. Keep all follow-up visits as told by your health care provider. This is important. Contact a health care provider if: You start urinating more often. You feel pain or irritation when you urinate. You notice blood in your urine. Your urine looks cloudy. You develop a fever. You begin vomiting. Get help right away if: You are unable to urinate. Summary Urinary frequency means urinating more often than usual. With urinary frequency, you may urinate every 1-2 hours even though you drink a normal amount of fluid and do not have a bladder infection or other bladder condition. Your health care provider may recommend that you keep a bladder diary, follow a bladder training program, or make dietary changes. If told by your health care provider, do Kegel exercises to strengthen the muscles that help control urination. Take over-the-counter and prescription medicines only as told by your health care provider. Contact a health care provider if your symptoms do not improve or get worse. This information is not intended to replace advice given to you by your health care provider. Make sure you discuss any questions you have with your healthcare provider. Document Revised: 06/08/2018 Document Reviewed: 06/08/2018 Elsevier Patient Education  2021 [employer]vier Inc.  

## 2021-05-28 NOTE — Progress Notes (Signed)
Subjective:   By signing my name below, I, Nicole Drake, attest that this documentation has been prepared under the direction and in the presence of Dr. Roma Schanz, DO. 05/28/2021    Patient ID: Nicole Drake, female    DOB: [birth year], 45 y.o.   MRN: [id number]  Chief Complaint  Patient presents with   Urinary Frequency    X2 days, pt states freq and cloudy, no burning or pain     HPI Patient is in today for a office visit. She complains of frequency at night. She also has cloudiness in her urine. She denies having any hematuria, dysuria and flank pain at this time.  No past medical history on file.  Past Surgical History:  Procedure Laterality Date   AUGMENTATION MAMMAPLASTY Bilateral    CERVIX SURGERY     partial remove/age 61   OVARIAN CYST REMOVAL     age 89, 82, 55   TONSILLECTOMY     age 37    Family History  Problem Relation Age of Onset   Heart disease Father 71       MI   Heart disease Maternal Grandfather 50       MI   Heart disease Paternal Grandfather 6       MI    Social History   Socioeconomic History   Marital status: Divorced    Spouse name: Not on file   Number of children: Not on file   Years of education: Not on file   Highest education level: Not on file  Occupational History   Not on file  Tobacco Use   Smoking status: Former    Packs/day: 0.25    Years: 18.00    Pack years: 4.50    Types: Cigarettes    Quit date: 08/23/2016    Years since quitting: 4.7   Smokeless tobacco: Never  Substance and Sexual Activity   Alcohol use: Yes    Comment: OCC   Drug use: No   Sexual activity: Yes  Other Topics Concern   Not on file  Social History Narrative   Not on file   Social Determinants of Health   Financial Resource Strain: Not on file  Food Insecurity: Not on file  Transportation Needs: Not on file  Physical Activity: Not on file  Stress: Not on file  Social Connections: Not on file  Intimate Partner Violence: Not  on file    Outpatient Medications Prior to Visit  Medication Sig Dispense Refill   amphetamine-dextroamphetamine (ADDERALL XR) 20 MG 24 hr capsule TAKE 1 CAPSULE (20 MG TOTAL) BY MOUTH EVERY MORNING. 30 capsule 0   No facility-administered medications prior to visit.    Allergies  Allergen Reactions   Penicillins Rash    Review of Systems  Constitutional:  Negative for chills, fever and malaise/fatigue.  HENT:  Negative for congestion and hearing loss.   Eyes:  Negative for discharge.  Respiratory:  Negative for cough, sputum production and shortness of breath.   Cardiovascular:  Negative for chest pain, palpitations and leg swelling.  Gastrointestinal:  Negative for abdominal pain, blood in stool, constipation, diarrhea, heartburn, nausea and vomiting.  Genitourinary:  Positive for frequency. Negative for dysuria, flank pain, hematuria and urgency.       (+)Cloudy urine  Musculoskeletal:  Negative for back pain, falls and myalgias.  Skin:  Negative for rash.  Neurological:  Negative for dizziness, sensory change, loss of consciousness, weakness and headaches.  Endo/Heme/Allergies:  Negative for environmental  allergies. Does not bruise/bleed easily.  Psychiatric/Behavioral:  Negative for depression and suicidal ideas. The patient is not nervous/anxious and does not have insomnia.       Objective:    Physical Exam Vitals and nursing note reviewed.  Constitutional:      General: She is not in acute distress.    Appearance: Normal appearance. She is not ill-appearing.  HENT:     Head: Normocephalic and atraumatic.     Right Ear: External ear normal.     Left Ear: External ear normal.  Eyes:     Extraocular Movements: Extraocular movements intact.     Pupils: Pupils are equal, round, and reactive to light.  Cardiovascular:     Rate and Rhythm: Normal rate and regular rhythm.     Pulses: Normal pulses.     Heart sounds: Normal heart sounds. No murmur heard.   No gallop.   Pulmonary:     Effort: Pulmonary effort is normal. No respiratory distress.     Breath sounds: Normal breath sounds. No wheezing, rhonchi or rales.  Abdominal:     General: There is no distension.     Palpations: Abdomen is soft.     Tenderness: There is abdominal tenderness (mild) in the suprapubic area and left lower quadrant. There is no guarding or rebound.  Skin:    General: Skin is warm and dry.  Neurological:     Mental Status: She is alert and oriented to person, place, and time.  Psychiatric:        Behavior: Behavior normal.    BP 98/70 (BP Location: Right Arm, Patient Position: Sitting, Cuff Size: Normal)   Pulse (!) 126   Temp 98.6 F (37 C) (Oral)   Resp 18   Ht 5\' 3"  (1.6 m)   Wt 137 lb 3.2 oz (62.2 kg)   SpO2 99%   BMI 24.30 kg/m  Wt Readings from Last 3 Encounters:  05/28/21 137 lb 3.2 oz (62.2 kg)  02/20/21 142 lb 9.6 oz (64.7 kg)  12/17/20 131 lb (59.4 kg)    Diabetic Foot Exam - Simple   No data filed    Lab Results  Component Value Date   WBC 7.6 02/20/2021   HGB 13.6 02/20/2021   HCT 39.8 02/20/2021   PLT 307 02/20/2021   GLUCOSE 77 02/20/2021   CHOL 134 11/29/2013   TRIG 53.0 11/29/2013   HDL 57.30 11/29/2013   LDLCALC 66 11/29/2013   ALT 18 02/20/2021   AST 19 02/20/2021   NA 137 02/20/2021   K 3.9 02/20/2021   CL 101 02/20/2021   CREATININE 0.73 02/20/2021   BUN 14 02/20/2021   CO2 25 02/20/2021   TSH 2.11 02/20/2021   HGBA1C 4.6 02/20/2021    Lab Results  Component Value Date   TSH 2.11 02/20/2021   Lab Results  Component Value Date   WBC 7.6 02/20/2021   HGB 13.6 02/20/2021   HCT 39.8 02/20/2021   MCV 96.6 02/20/2021   PLT 307 02/20/2021   Lab Results  Component Value Date   NA 137 02/20/2021   K 3.9 02/20/2021   CO2 25 02/20/2021   GLUCOSE 77 02/20/2021   BUN 14 02/20/2021   CREATININE 0.73 02/20/2021   BILITOT 0.4 02/20/2021   ALKPHOS 37 (L) 11/29/2013   AST 19 02/20/2021   ALT 18 02/20/2021   PROT 7.1  02/20/2021   ALBUMIN 4.5 11/29/2013   CALCIUM 9.2 02/20/2021   GFR 108.02 11/29/2013   Lab  Results  Component Value Date   CHOL 134 11/29/2013   Lab Results  Component Value Date   HDL 57.30 11/29/2013   Lab Results  Component Value Date   LDLCALC 66 11/29/2013   Lab Results  Component Value Date   TRIG 53.0 11/29/2013   Lab Results  Component Value Date   CHOLHDL 2 11/29/2013   Lab Results  Component Value Date   HGBA1C 4.6 02/20/2021       Assessment & Plan:   Problem List Items Addressed This Visit   None Visit Diagnoses     Urinary frequency    -  Primary   Relevant Medications   nitrofurantoin, macrocrystal-monohydrate, (MACROBID) 100 MG capsule   Other Relevant Orders   POCT Urinalysis Dipstick (Automated) (Completed)   Urine Culture (Completed)     Macrobid x 7 days  Culture pending  Rto prn   Meds ordered this encounter  Medications   nitrofurantoin, macrocrystal-monohydrate, (MACROBID) 100 MG capsule    Sig: Take 1 capsule (100 mg total) by mouth 2 (two) times daily.    Dispense:  14 capsule    Refill:  0    I, Dr. Roma Schanz, DO, personally preformed the services described in this documentation.  All medical record entries made by the scribe were at my direction and in my presence.  I have reviewed the chart and discharge instructions (if applicable) and agree that the record reflects my personal performance and is accurate and complete. 05/28/2021   I,Nicole Drake,acting as a Education administrator for [employer], DO.,have documented all relevant documentation on the behalf of Ann Held, DO,as directed by  Ann Held, DO while in the presence of Ann Held, DO.   Ann Held, DO

## 2021-05-29 LAB — URINE CULTURE
MICRO NUMBER:: 12016064
Result:: NO GROWTH
SPECIMEN QUALITY:: ADEQUATE

## 2021-06-05 ENCOUNTER — Encounter: Payer: Self-pay | Admitting: Family Medicine

## 2021-06-16 DIAGNOSIS — Z01419 Encounter for gynecological examination (general) (routine) without abnormal findings: Secondary | ICD-10-CM | POA: Diagnosis not present

## 2021-06-16 DIAGNOSIS — Z113 Encounter for screening for infections with a predominantly sexual mode of transmission: Secondary | ICD-10-CM | POA: Diagnosis not present

## 2021-06-16 DIAGNOSIS — Z6825 Body mass index (BMI) 25.0-25.9, adult: Secondary | ICD-10-CM | POA: Diagnosis not present

## 2021-06-16 DIAGNOSIS — Z124 Encounter for screening for malignant neoplasm of cervix: Secondary | ICD-10-CM | POA: Diagnosis not present

## 2021-06-25 ENCOUNTER — Other Ambulatory Visit: Payer: Self-pay | Admitting: Family Medicine

## 2021-06-25 DIAGNOSIS — F988 Other specified behavioral and emotional disorders with onset usually occurring in childhood and adolescence: Secondary | ICD-10-CM

## 2021-06-26 NOTE — Telephone Encounter (Signed)
Requesting: Adderall XR Contract: 02/20/21 UDS: 02/20/21 Last OV: 05/28/21 Next OV: N/A Last Refill: 05/25/21, #30--0 RF Database:   Please advise

## 2021-06-29 ENCOUNTER — Other Ambulatory Visit (HOSPITAL_BASED_OUTPATIENT_CLINIC_OR_DEPARTMENT_OTHER): Payer: Self-pay

## 2021-06-29 MED ORDER — AMPHETAMINE-DEXTROAMPHET ER 20 MG PO CP24
ORAL_CAPSULE | Freq: Every morning | ORAL | 0 refills | Status: DC
  Filled 2021-06-29: qty 30, 30d supply, fill #0

## 2021-07-17 ENCOUNTER — Encounter: Payer: Self-pay | Admitting: Family Medicine

## 2021-07-17 ENCOUNTER — Other Ambulatory Visit: Payer: Self-pay

## 2021-07-17 ENCOUNTER — Ambulatory Visit
Admission: RE | Admit: 2021-07-17 | Discharge: 2021-07-17 | Disposition: A | Discharge status: Home / Self Care | Payer: BC Managed Care – PPO | Source: Ambulatory Visit | Attending: Emergency Medicine | Admitting: Emergency Medicine

## 2021-07-17 VITALS — BP 108/67 | HR 100 | Temp 98.0°F | Resp 16

## 2021-07-17 DIAGNOSIS — K121 Other forms of stomatitis: Secondary | ICD-10-CM | POA: Diagnosis not present

## 2021-07-17 MED ORDER — TRIAMCINOLONE ACETONIDE 0.1 % MT PSTE: 1.0000 "application " | PASTE | Freq: Two times a day (BID) | OROMUCOSAL | 12 refills | Status: DC

## 2021-07-17 MED ORDER — IBUPROFEN 800 MG PO TABS: 800.0000 mg | ORAL_TABLET | Freq: Three times a day (TID) | ORAL | 0 refills | Status: DC

## 2021-07-17 MED ORDER — CLINDAMYCIN HCL 300 MG PO CAPS: 300.0000 mg | ORAL_CAPSULE | Freq: Three times a day (TID) | ORAL | 0 refills | Status: AC

## 2021-07-17 MED ORDER — CHLORHEXIDINE GLUCONATE 0.12 % MT SOLN: 15.0000 mL | Freq: Two times a day (BID) | OROMUCOSAL | 0 refills | Status: DC

## 2021-07-17 NOTE — ED Triage Notes (Signed)
Patient presents to [facility] for evaluation of 1 week of a mouth sore after biting her cheek.  States she has been doing peroxide and salt water without relief and it just seems to be getting more painful.

## 2021-07-17 NOTE — Discharge Instructions (Addendum)
Begin clindamycin every 8 hours x1 week to cover for back serial infection Peridex mouth rinse twice daily Triamcinolone twice daily to area to help with pain/swelling Tylenol and ibuprofen Alternate ice and heat Follow-up if not improving or worsening

## 2021-07-17 NOTE — ED Provider Notes (Signed)
[facility]    CSN: [id number] Arrival date & time: 07/17/21  1251      History   Chief Complaint Chief Complaint  Patient presents with   Oral Pain    HPI Nicole Drake is a 45 y.o. female presenting today for evaluation of a sore in her mouth.  Reports over the past week has had an area to her left lower cheek/lip that has been painful.  Feels that this developed after she bit her cheek.  Using salt water gargles and peroxide without relief.  Feel symptoms are been worsening.  Denies any other sores.  Denies history of similar.  Does report some occasional tobacco use.  HPI  History reviewed. No pertinent past medical history.  Patient Active Problem List   Diagnosis Date Noted   Seasonal allergies 03/28/2019   ADD (attention deficit disorder) 09/16/2015    Past Surgical History:  Procedure Laterality Date   AUGMENTATION MAMMAPLASTY Bilateral    CERVIX SURGERY     partial remove/age 10   OVARIAN CYST REMOVAL     age 78, 61, 65   TONSILLECTOMY     age 5    OB History     Gravida  3   Para  2   Term      Preterm      AB  1   Living         SAB      IAB  1   Ectopic      Multiple      Live Births  2            Home Medications    Prior to Admission medications   Medication Sig Start Date End Date Taking? Authorizing Provider  amphetamine-dextroamphetamine (ADDERALL XR) 20 MG 24 hr capsule TAKE 1 CAPSULE (20 MG TOTAL) BY MOUTH EVERY MORNING. 06/29/21 12/26/21 Yes Lowne Koren Shiver, DO  chlorhexidine (PERIDEX) 0.12 % solution Use as directed 15 mLs in the mouth or throat 2 (two) times daily. 07/17/21  Yes Richad Ramsay C, PA-C  clindamycin (CLEOCIN) 300 MG capsule Take 1 capsule (300 mg total) by mouth 3 (three) times daily for 7 days. 07/17/21 07/24/21 Yes Valaree Fresquez C, PA-C  ibuprofen (ADVIL) 800 MG tablet Take 1 tablet (800 mg total) by mouth 3 (three) times daily. 07/17/21  Yes Holton Sidman C, PA-C  triamcinolone  (KENALOG) 0.1 % paste Use as directed 1 application in the mouth or throat 2 (two) times daily. 07/17/21  Yes Mayline Dragon, Elesa Hacker, PA-C    Family History Family History  Problem Relation Age of Onset   Heart disease Father 44       MI   Heart disease Maternal Grandfather 36       MI   Heart disease Paternal Grandfather 60       MI    Social History Social History   Tobacco Use   Smoking status: Former    Packs/day: 0.25    Years: 18.00    Pack years: 4.50    Types: Cigarettes    Quit date: 08/23/2016    Years since quitting: 4.9   Smokeless tobacco: Never  Substance Use Topics   Alcohol use: Yes    Comment: OCC   Drug use: No     Allergies   Penicillins   Review of Systems Review of Systems  Constitutional:  Negative for fatigue and fever.  HENT:  Positive for mouth sores.   Eyes:  Negative for visual disturbance.  Respiratory:  Negative for shortness of breath.   Cardiovascular:  Negative for chest pain.  Gastrointestinal:  Negative for abdominal pain, nausea and vomiting.  Genitourinary:  Negative for genital sores.  Musculoskeletal:  Negative for arthralgias and joint swelling.  Skin:  Negative for color change, rash and wound.  Neurological:  Negative for dizziness, weakness, light-headedness and headaches.    Physical Exam Triage Vital Signs ED Triage Vitals  Enc Vitals Group     BP      Pulse      Resp      Temp      Temp src      SpO2      Weight      Height      Head Circumference      Peak Flow      Pain Score      Pain Loc      Pain Edu?      Excl. in [place]?    No data found.  Updated Vital Signs BP 108/67 (BP Location: Right Arm)   Pulse 100   Temp 98 F (36.7 C) (Oral)   Resp 16   SpO2 98%   Visual Acuity Right Eye Distance:   Left Eye Distance:   Bilateral Distance:    Right Eye Near:   Left Eye Near:    Bilateral Near:     Physical Exam Vitals and nursing note reviewed.  Constitutional:      Appearance: She is  well-developed.     Comments: No acute distress  HENT:     Head: Normocephalic and atraumatic.     Comments: Mild swelling noted to left lower lip without overlying erythema, minimal induration noted    Nose: Nose normal.     Mouth/Throat:     Comments: Left lower lip on oral mucosa with approximately 1 cm slightly yellowish sore noted, no surrounding erythema, no other lesions noted, no oral swelling Eyes:     Conjunctiva/sclera: Conjunctivae normal.  Cardiovascular:     Rate and Rhythm: Normal rate.  Pulmonary:     Effort: Pulmonary effort is normal. No respiratory distress.  Abdominal:     General: There is no distension.  Musculoskeletal:        General: Normal range of motion.     Cervical back: Neck supple.  Skin:    General: Skin is warm and dry.  Neurological:     Mental Status: She is alert and oriented to person, place, and time.     UC Treatments / Results  Labs (all labs ordered are listed, but only abnormal results are displayed) Labs Reviewed - No data to display  EKG   Radiology No results found.  Procedures Procedures (including critical care time)  Medications Ordered in UC Medications - No data to display  Initial Impression / Assessment and Plan / UC Course  I have reviewed the triage vital signs and the nursing notes.  Pertinent labs & imaging results that were available during my care of the patient were reviewed by me and considered in my medical decision making (see chart for details).     Oral ulcer noted to oral mucosa, discussed likely viral and recommended symptomatic and supportive care with Peridex, topical triamcinolone and anti-inflammatories, may alternate ice and heat.  Did opt to go ahead and cover with clindamycin for possible bacterial etiology given associated swelling in the area.  Continue to monitor,Discussed strict return precautions. Patient verbalized understanding  and is agreeable with plan.  Final Clinical Impressions(s)  / UC Diagnoses   Final diagnoses:  Oral ulceration     Discharge Instructions      Begin clindamycin every 8 hours x1 week to cover for back serial infection Peridex mouth rinse twice daily Triamcinolone twice daily to area to help with pain/swelling Tylenol and ibuprofen Alternate ice and heat Follow-up if not improving or worsening     ED Prescriptions     Medication Sig Dispense Auth. Provider   clindamycin (CLEOCIN) 300 MG capsule Take 1 capsule (300 mg total) by mouth 3 (three) times daily for 7 days. 21 capsule Emonni Depasquale C, PA-C   triamcinolone (KENALOG) 0.1 % paste Use as directed 1 application in the mouth or throat 2 (two) times daily. 5 g Ayan Yankey C, PA-C   ibuprofen (ADVIL) 800 MG tablet Take 1 tablet (800 mg total) by mouth 3 (three) times daily. 21 tablet Fisher Hargadon C, PA-C   chlorhexidine (PERIDEX) 0.12 % solution Use as directed 15 mLs in the mouth or throat 2 (two) times daily. 120 mL Wille Aubuchon, [place] C, PA-C      PDMP not reviewed this encounter.   Joneen Caraway [place] C, [place] 07/21/21 [phone]

## 2021-07-20 ENCOUNTER — Ambulatory Visit: Payer: BC Managed Care – PPO | Admitting: Family Medicine

## 2021-07-20 NOTE — Progress Notes (Incomplete)
Subjective:   By signing my name below, I, Zite Okoli, attest that this documentation has been prepared under the direction and in the presence of Roma Schanz R DO. 07/20/2021   Patient ID: Nicole Drake, female    DOB: [birth year], 45 y.o.   MRN: [id number]  No chief complaint on file.   HPI Patient is in today for an office visit.  No past medical history on file.  Past Surgical History:  Procedure Laterality Date   AUGMENTATION MAMMAPLASTY Bilateral    CERVIX SURGERY     partial remove/age 45   OVARIAN CYST REMOVAL     age 73, 55, 40   TONSILLECTOMY     age 67    Family History  Problem Relation Age of Onset   Heart disease Father 27       MI   Heart disease Maternal Grandfather 65       MI   Heart disease Paternal Grandfather 75       MI    Social History   Socioeconomic History   Marital status: Divorced    Spouse name: Not on file   Number of children: Not on file   Years of education: Not on file   Highest education level: Not on file  Occupational History   Not on file  Tobacco Use   Smoking status: Former    Packs/day: 0.25    Years: 18.00    Pack years: 4.50    Types: Cigarettes    Quit date: 08/23/2016    Years since quitting: 4.9   Smokeless tobacco: Never  Substance and Sexual Activity   Alcohol use: Yes    Comment: OCC   Drug use: No   Sexual activity: Yes  Other Topics Concern   Not on file  Social History Narrative   Not on file   Social Determinants of Health   Financial Resource Strain: Not on file  Food Insecurity: Not on file  Transportation Needs: Not on file  Physical Activity: Not on file  Stress: Not on file  Social Connections: Not on file  Intimate Partner Violence: Not on file    Outpatient Medications Prior to Visit  Medication Sig Dispense Refill   amphetamine-dextroamphetamine (ADDERALL XR) 20 MG 24 hr capsule TAKE 1 CAPSULE (20 MG TOTAL) BY MOUTH EVERY MORNING. 30 capsule 0   chlorhexidine (PERIDEX)  0.12 % solution Use as directed 15 mLs in the mouth or throat 2 (two) times daily. 120 mL 0   clindamycin (CLEOCIN) 300 MG capsule Take 1 capsule (300 mg total) by mouth 3 (three) times daily for 7 days. 21 capsule 0   ibuprofen (ADVIL) 800 MG tablet Take 1 tablet (800 mg total) by mouth 3 (three) times daily. 21 tablet 0   triamcinolone (KENALOG) 0.1 % paste Use as directed 1 application in the mouth or throat 2 (two) times daily. 5 g 12   No facility-administered medications prior to visit.    Allergies  Allergen Reactions   Penicillins Rash    ROS     Objective:    Physical Exam Constitutional:      General: She is not in acute distress.    Appearance: Normal appearance. She is not ill-appearing.  HENT:     Head: Normocephalic and atraumatic.     Right Ear: External ear normal.     Left Ear: External ear normal.  Eyes:     Extraocular Movements: Extraocular movements intact.  Pupils: Pupils are equal, round, and reactive to light.  Cardiovascular:     Rate and Rhythm: Normal rate and regular rhythm.     Pulses: Normal pulses.     Heart sounds: Normal heart sounds. No murmur heard.   No gallop.  Pulmonary:     Effort: Pulmonary effort is normal. No respiratory distress.     Breath sounds: Normal breath sounds. No wheezing, rhonchi or rales.  Abdominal:     General: Bowel sounds are normal. There is no distension.     Palpations: Abdomen is soft. There is no mass.     Tenderness: There is no abdominal tenderness. There is no guarding or rebound.     Hernia: No hernia is present.  Musculoskeletal:     Cervical back: Normal range of motion and neck supple.  Lymphadenopathy:     Cervical: No cervical adenopathy.  Skin:    General: Skin is warm and dry.  Neurological:     Mental Status: She is alert and oriented to person, place, and time.  Psychiatric:        Behavior: Behavior normal.    There were no vitals taken for this visit. Wt Readings from Last 3  Encounters:  05/28/21 137 lb 3.2 oz (62.2 kg)  02/20/21 142 lb 9.6 oz (64.7 kg)  12/17/20 131 lb (59.4 kg)    Diabetic Foot Exam - Simple   No data filed    Lab Results  Component Value Date   WBC 7.6 02/20/2021   HGB 13.6 02/20/2021   HCT 39.8 02/20/2021   PLT 307 02/20/2021   GLUCOSE 77 02/20/2021   CHOL 134 11/29/2013   TRIG 53.0 11/29/2013   HDL 57.30 11/29/2013   LDLCALC 66 11/29/2013   ALT 18 02/20/2021   AST 19 02/20/2021   NA 137 02/20/2021   K 3.9 02/20/2021   CL 101 02/20/2021   CREATININE 0.73 02/20/2021   BUN 14 02/20/2021   CO2 25 02/20/2021   TSH 2.11 02/20/2021   HGBA1C 4.6 02/20/2021    Lab Results  Component Value Date   TSH 2.11 02/20/2021   Lab Results  Component Value Date   WBC 7.6 02/20/2021   HGB 13.6 02/20/2021   HCT 39.8 02/20/2021   MCV 96.6 02/20/2021   PLT 307 02/20/2021   Lab Results  Component Value Date   NA 137 02/20/2021   K 3.9 02/20/2021   CO2 25 02/20/2021   GLUCOSE 77 02/20/2021   BUN 14 02/20/2021   CREATININE 0.73 02/20/2021   BILITOT 0.4 02/20/2021   ALKPHOS 37 (L) 11/29/2013   AST 19 02/20/2021   ALT 18 02/20/2021   PROT 7.1 02/20/2021   ALBUMIN 4.5 11/29/2013   CALCIUM 9.2 02/20/2021   GFR 108.02 11/29/2013   Lab Results  Component Value Date   CHOL 134 11/29/2013   Lab Results  Component Value Date   HDL 57.30 11/29/2013   Lab Results  Component Value Date   LDLCALC 66 11/29/2013   Lab Results  Component Value Date   TRIG 53.0 11/29/2013   Lab Results  Component Value Date   CHOLHDL 2 11/29/2013   Lab Results  Component Value Date   HGBA1C 4.6 02/20/2021       Assessment & Plan:   Problem List Items Addressed This Visit   None  No orders of the defined types were placed in this encounter.   I,Zite Okoli,acting as a Education administrator for [employer], DO.,have documented all  relevant documentation on the behalf of Ann Held, DO,as directed by  Ann Held, DO  while in the presence of Ann Held, DO.   I, Ann Held DO., personally preformed the services described in this documentation.  All medical record entries made by the scribe were at my direction and in my presence.  I have reviewed the chart and discharge instructions (if applicable) and agree that the record reflects my personal performance and is accurate and complete. 07/20/2021

## 2021-07-30 ENCOUNTER — Other Ambulatory Visit (HOSPITAL_BASED_OUTPATIENT_CLINIC_OR_DEPARTMENT_OTHER): Payer: Self-pay

## 2021-07-30 ENCOUNTER — Other Ambulatory Visit: Payer: Self-pay | Admitting: Family Medicine

## 2021-07-30 DIAGNOSIS — F988 Other specified behavioral and emotional disorders with onset usually occurring in childhood and adolescence: Secondary | ICD-10-CM

## 2021-07-31 NOTE — Telephone Encounter (Signed)
Requesting: Adderall XR Contract: 02/20/21  UDS: 02/20/21 Last OV: 05/28/21 Next OV: N/A Last Refill: 06/29/21, #30--0 RF Database:   Please advise

## 2021-08-02 MED ORDER — AMPHETAMINE-DEXTROAMPHET ER 20 MG PO CP24
ORAL_CAPSULE | Freq: Every morning | ORAL | 0 refills | Status: DC
Start: 2021-08-02 — End: 2021-09-01
  Filled 2021-08-02: qty 30, 30d supply, fill #0

## 2021-08-03 ENCOUNTER — Other Ambulatory Visit (HOSPITAL_BASED_OUTPATIENT_CLINIC_OR_DEPARTMENT_OTHER): Payer: Self-pay

## 2021-09-01 ENCOUNTER — Other Ambulatory Visit (HOSPITAL_BASED_OUTPATIENT_CLINIC_OR_DEPARTMENT_OTHER): Payer: Self-pay

## 2021-09-01 ENCOUNTER — Other Ambulatory Visit: Payer: Self-pay | Admitting: Family Medicine

## 2021-09-01 DIAGNOSIS — F988 Other specified behavioral and emotional disorders with onset usually occurring in childhood and adolescence: Secondary | ICD-10-CM

## 2021-09-01 MED ORDER — AMPHETAMINE-DEXTROAMPHET ER 20 MG PO CP24
ORAL_CAPSULE | Freq: Every morning | ORAL | 0 refills | Status: DC
  Filled 2021-09-01: qty 30, 30d supply, fill #0

## 2021-09-01 NOTE — Telephone Encounter (Signed)
Requesting: Adderall XR 20mg  Contract: 02/20/2021 UDS: 02/20/2021 Last Visit: 05/28/2021 Next Visit: None Last Refill: 08/02/2021 #30 and 0RF  Please Advise

## 2021-10-07 ENCOUNTER — Other Ambulatory Visit (HOSPITAL_BASED_OUTPATIENT_CLINIC_OR_DEPARTMENT_OTHER): Payer: Self-pay

## 2021-10-07 ENCOUNTER — Other Ambulatory Visit: Payer: Self-pay | Admitting: Family Medicine

## 2021-10-07 DIAGNOSIS — F988 Other specified behavioral and emotional disorders with onset usually occurring in childhood and adolescence: Secondary | ICD-10-CM

## 2021-10-07 MED ORDER — AMPHETAMINE-DEXTROAMPHET ER 20 MG PO CP24
ORAL_CAPSULE | Freq: Every morning | ORAL | 0 refills | Status: DC
  Filled 2021-10-07: qty 30, 30d supply, fill #0

## 2021-10-07 NOTE — Telephone Encounter (Signed)
Requesting: Adderall XR 20mg   Contract: 02/20/2021 UDS: 02/20/2021 Last Visit: 05/28/2021 Next Visit: None Last Refill: 09/01/2021 #30 and 0RF  Please Advise

## 2021-11-09 ENCOUNTER — Other Ambulatory Visit (HOSPITAL_BASED_OUTPATIENT_CLINIC_OR_DEPARTMENT_OTHER): Payer: Self-pay

## 2021-11-09 ENCOUNTER — Other Ambulatory Visit: Payer: Self-pay | Admitting: Family Medicine

## 2021-11-09 DIAGNOSIS — F988 Other specified behavioral and emotional disorders with onset usually occurring in childhood and adolescence: Secondary | ICD-10-CM

## 2021-11-09 MED ORDER — AMPHETAMINE-DEXTROAMPHET ER 20 MG PO CP24
ORAL_CAPSULE | Freq: Every morning | ORAL | 0 refills | Status: DC
  Filled 2021-11-09: qty 30, 30d supply, fill #0

## 2021-11-09 NOTE — Telephone Encounter (Signed)
Requesting: Adderall XR 20mg   Contract: 02/20/2021 UDS: 02/20/2021 Last Visit: 05/28/2021 Next Visit: None  Last Refill: 10/07/2021 #30 and 0RF  Please Advise

## 2021-12-09 ENCOUNTER — Telehealth: Payer: Self-pay | Admitting: Family Medicine

## 2021-12-09 DIAGNOSIS — F988 Other specified behavioral and emotional disorders with onset usually occurring in childhood and adolescence: Secondary | ICD-10-CM

## 2021-12-10 ENCOUNTER — Other Ambulatory Visit (HOSPITAL_BASED_OUTPATIENT_CLINIC_OR_DEPARTMENT_OTHER): Payer: Self-pay

## 2021-12-10 MED ORDER — AMPHETAMINE-DEXTROAMPHET ER 20 MG PO CP24
20.0000 mg | ORAL_CAPSULE | Freq: Every morning | ORAL | 0 refills | Status: DC
  Filled 2021-12-10: qty 30, 30d supply, fill #0

## 2021-12-10 NOTE — Telephone Encounter (Signed)
Called patient and patient does not have a VM set up  I sent a mychart message for patient to call and schedule an appt

## 2021-12-10 NOTE — Telephone Encounter (Signed)
Probably needs 6 mo f/u.

## 2021-12-10 NOTE — Telephone Encounter (Signed)
Noted  

## 2021-12-10 NOTE — Telephone Encounter (Signed)
[place] Pt  Requesting: Adderall XR 20mg  Contract: 02/20/2021 UDS: 02/20/2021 Last Visit: 05/28/2021 Next Visit: None Last Refill: 11/09/2021 #30 and 0RF  Please Advise

## 2021-12-11 ENCOUNTER — Other Ambulatory Visit (HOSPITAL_BASED_OUTPATIENT_CLINIC_OR_DEPARTMENT_OTHER): Payer: Self-pay

## 2022-01-20 ENCOUNTER — Other Ambulatory Visit (HOSPITAL_BASED_OUTPATIENT_CLINIC_OR_DEPARTMENT_OTHER): Payer: Self-pay

## 2022-01-20 ENCOUNTER — Other Ambulatory Visit: Payer: Self-pay | Admitting: Family Medicine

## 2022-01-20 DIAGNOSIS — F988 Other specified behavioral and emotional disorders with onset usually occurring in childhood and adolescence: Secondary | ICD-10-CM

## 2022-01-20 MED ORDER — AMPHETAMINE-DEXTROAMPHET ER 20 MG PO CP24
20.0000 mg | ORAL_CAPSULE | Freq: Every morning | ORAL | 0 refills | Status: DC
  Filled 2022-01-20: qty 30, 30d supply, fill #0

## 2022-01-20 NOTE — Telephone Encounter (Signed)
Last OV--05/28/2021 Last RF--12/10/2021 UDS--05/08/2020 CSC--04/07/2018

## 2022-01-22 ENCOUNTER — Ambulatory Visit: Payer: BC Managed Care – PPO | Admitting: Family Medicine

## 2022-01-22 ENCOUNTER — Encounter: Payer: Self-pay | Admitting: Family Medicine

## 2022-01-22 VITALS — BP 105/65 | HR 74 | Ht 63.0 in | Wt 141.4 lb

## 2022-01-22 DIAGNOSIS — B309 Viral conjunctivitis, unspecified: Secondary | ICD-10-CM

## 2022-01-22 DIAGNOSIS — Z79899 Other long term (current) drug therapy: Secondary | ICD-10-CM

## 2022-01-22 DIAGNOSIS — F988 Other specified behavioral and emotional disorders with onset usually occurring in childhood and adolescence: Secondary | ICD-10-CM | POA: Diagnosis not present

## 2022-01-22 NOTE — Progress Notes (Signed)
Acute Office Visit  Subjective:    Patient ID: Nicole Drake, female    DOB: 1976-02-07, 46 y.o.   MRN: [id number]  CC: pink eye    HPI Patient is in today for eye irritation and ADD follow-up.   Patient states 2-3 days ago she had itchy/red eyes (left slightly worse than right). There has been some watering and mild crusting first thing in the morning. Reports symptoms mostly cleared yesterday - no longer red, but still mildly itchy at times. She denies any fevers, URI symptoms, vision changes, pain.   ADD follow-up: Tolerating current dose (Adderall 20 mg daily), no side effects.  Good response, but she has to take it early morning in order to be able to sleep at night. Due for UDS  Refill for this month sent in by Dr. Nani Ravens, PCP not in office.  Has missed a few days (because she ran out of meds for awhile, may not show up in urine)    No past medical history on file.  Past Surgical History:  Procedure Laterality Date   AUGMENTATION MAMMAPLASTY Bilateral    CERVIX SURGERY     partial remove/age 74   OVARIAN CYST REMOVAL     age 66, 30, 46   TONSILLECTOMY     age 50    Family History  Problem Relation Age of Onset   Heart disease Father 52       MI   Heart disease Maternal Grandfather 5       MI   Heart disease Paternal Grandfather 29       MI    Social History   Socioeconomic History   Marital status: Divorced    Spouse name: Not on file   Number of children: Not on file   Years of education: Not on file   Highest education level: Not on file  Occupational History   Not on file  Tobacco Use   Smoking status: Former    Packs/day: 0.25    Years: 18.00    Pack years: 4.50    Types: Cigarettes    Quit date: 08/23/2016    Years since quitting: 5.4   Smokeless tobacco: Never  Substance and Sexual Activity   Alcohol use: Yes    Comment: OCC   Drug use: No   Sexual activity: Yes  Other Topics Concern   Not on file  Social History Narrative    Not on file   Social Determinants of Health   Financial Resource Strain: Not on file  Food Insecurity: Not on file  Transportation Needs: Not on file  Physical Activity: Not on file  Stress: Not on file  Social Connections: Not on file  Intimate Partner Violence: Not on file    Outpatient Medications Prior to Visit  Medication Sig Dispense Refill   amphetamine-dextroamphetamine (ADDERALL XR) 20 MG 24 hr capsule Take 1 capsule (20 mg total) by mouth every morning. 30 capsule 0   chlorhexidine (PERIDEX) 0.12 % solution Use as directed 15 mLs in the mouth or throat 2 (two) times daily. 120 mL 0   ibuprofen (ADVIL) 800 MG tablet Take 1 tablet (800 mg total) by mouth 3 (three) times daily. 21 tablet 0   triamcinolone (KENALOG) 0.1 % paste Use as directed 1 application in the mouth or throat 2 (two) times daily. 5 g 12   No facility-administered medications prior to visit.    Allergies  Allergen Reactions   Penicillins Rash    Review  of Systems All review of systems negative except what is listed in the HPI     Objective:    Physical Exam Vitals reviewed.  Constitutional:      Appearance: Normal appearance.  HENT:     Head: Normocephalic and atraumatic.  Eyes:     General:        Right eye: No discharge.        Left eye: No discharge.     Extraocular Movements: Extraocular movements intact.     Conjunctiva/sclera: Conjunctivae normal.     Pupils: Pupils are equal, round, and reactive to light.  Cardiovascular:     Rate and Rhythm: Normal rate and regular rhythm.  Pulmonary:     Effort: Pulmonary effort is normal.     Breath sounds: Normal breath sounds.  Skin:    General: Skin is warm and dry.     Findings: No rash.  Neurological:     General: No focal deficit present.     Mental Status: She is alert and oriented to person, place, and time. Mental status is at baseline.  Psychiatric:        Mood and Affect: Mood normal.        Behavior: Behavior normal.         Thought Content: Thought content normal.        Judgment: Judgment normal.    BP 105/65    Pulse 74    Ht 5\' 3"  (1.6 m)    Wt 141 lb 6.4 oz (64.1 kg)    SpO2 100%    BMI 25.05 kg/m  Wt Readings from Last 3 Encounters:  01/22/22 141 lb 6.4 oz (64.1 kg)  05/28/21 137 lb 3.2 oz (62.2 kg)  02/20/21 142 lb 9.6 oz (64.7 kg)    Health Maintenance Due  Topic Date Due   COVID-19 Vaccine (1) Never done   HIV Screening  Never done   Hepatitis C Screening  Never done   COLONOSCOPY (Pts 45-82yrs Insurance coverage will need to be confirmed)  Never done   PAP SMEAR-Modifier  04/12/2020   INFLUENZA VACCINE  Never done    There are no preventive care reminders to display for this patient.   Lab Results  Component Value Date   TSH 2.11 02/20/2021   Lab Results  Component Value Date   WBC 7.6 02/20/2021   HGB 13.6 02/20/2021   HCT 39.8 02/20/2021   MCV 96.6 02/20/2021   PLT 307 02/20/2021   Lab Results  Component Value Date   NA 137 02/20/2021   K 3.9 02/20/2021   CO2 25 02/20/2021   GLUCOSE 77 02/20/2021   BUN 14 02/20/2021   CREATININE 0.73 02/20/2021   BILITOT 0.4 02/20/2021   ALKPHOS 37 (L) 11/29/2013   AST 19 02/20/2021   ALT 18 02/20/2021   PROT 7.1 02/20/2021   ALBUMIN 4.5 11/29/2013   CALCIUM 9.2 02/20/2021   GFR 108.02 11/29/2013   Lab Results  Component Value Date   CHOL 134 11/29/2013   Lab Results  Component Value Date   HDL 57.30 11/29/2013   Lab Results  Component Value Date   LDLCALC 66 11/29/2013   Lab Results  Component Value Date   TRIG 53.0 11/29/2013   Lab Results  Component Value Date   CHOLHDL 2 11/29/2013   Lab Results  Component Value Date   HGBA1C 4.6 02/20/2021       Assessment & Plan:   1. High risk medications (not anticoagulants) long-term  use 2. ADD Updating UDS today. Patient to follow-up with PCP for routine exam in the next few months. - DRUG MONITORING, PANEL 8 WITH CONFIRMATION, URINE  2. Viral Conjunctivitis Now  mostly resolved. If itching returns, can try OTC allergy eye drops. Education added to AVS. Patient aware of signs/symptoms requiring further/urgent evaluation.    Follow-up with PCP for routine exam. Follow-up as needed.   Terrilyn Saver, NP

## 2022-01-22 NOTE — Patient Instructions (Signed)
Eye irritation is now resolved - could have been a viral or allergic response. Please let us know if symptoms return  Urine drug screen today for Adderall.

## 2022-01-24 LAB — DRUG MONITORING, PANEL 8 WITH CONFIRMATION, URINE
6 Acetylmorphine: NEGATIVE ng/mL (ref <10)
Alcohol Metabolites: NEGATIVE ng/mL (ref <500)
Amphetamines: NEGATIVE ng/mL (ref <500)
Benzodiazepines: NEGATIVE ng/mL (ref <100)
Buprenorphine, Urine: NEGATIVE ng/mL (ref <5)
Cocaine Metabolite: NEGATIVE ng/mL (ref <150)
Creatinine: 22.6 mg/dL (ref >=20.0)
Ethyl Glucuronide (ETG): NEGATIVE ng/mL (ref <500)
Ethyl Sulfate (ETS): NEGATIVE ng/mL (ref <100)
MDMA: NEGATIVE ng/mL (ref <500)
Marijuana Metabolite: NEGATIVE ng/mL (ref <20)
Opiates: NEGATIVE ng/mL (ref <100)
Oxidant: NEGATIVE ug/mL (ref <200)
Oxycodone: NEGATIVE ng/mL (ref <100)
pH: 6.9 (ref 4.5–9.0)

## 2022-01-24 LAB — DM TEMPLATE

## 2022-02-19 ENCOUNTER — Other Ambulatory Visit (HOSPITAL_BASED_OUTPATIENT_CLINIC_OR_DEPARTMENT_OTHER): Payer: Self-pay

## 2022-02-19 ENCOUNTER — Other Ambulatory Visit: Payer: Self-pay | Admitting: Family Medicine

## 2022-02-19 DIAGNOSIS — F988 Other specified behavioral and emotional disorders with onset usually occurring in childhood and adolescence: Secondary | ICD-10-CM

## 2022-02-19 MED ORDER — AMPHETAMINE-DEXTROAMPHET ER 10 MG PO CP24
20.0000 mg | ORAL_CAPSULE | Freq: Every morning | ORAL | 0 refills | Status: DC
  Filled 2022-02-19: qty 60, 30d supply, fill #0

## 2022-02-19 NOTE — Telephone Encounter (Signed)
Last OV---01/22/2022 ?Last RF---#30 no refills on 01/20/2022 ?UDS--updated on 01/31/2022 ?CSC---04/07/2018 ?No scheduled upcoming appts.  ?

## 2022-02-22 ENCOUNTER — Other Ambulatory Visit (HOSPITAL_BASED_OUTPATIENT_CLINIC_OR_DEPARTMENT_OTHER): Payer: Self-pay

## 2022-03-05 DIAGNOSIS — L814 Other melanin hyperpigmentation: Secondary | ICD-10-CM | POA: Diagnosis not present

## 2022-03-05 DIAGNOSIS — L821 Other seborrheic keratosis: Secondary | ICD-10-CM | POA: Diagnosis not present

## 2022-03-05 DIAGNOSIS — D225 Melanocytic nevi of trunk: Secondary | ICD-10-CM | POA: Diagnosis not present

## 2022-03-24 ENCOUNTER — Other Ambulatory Visit: Payer: Self-pay | Admitting: Family Medicine

## 2022-03-24 ENCOUNTER — Other Ambulatory Visit (HOSPITAL_BASED_OUTPATIENT_CLINIC_OR_DEPARTMENT_OTHER): Payer: Self-pay

## 2022-03-24 DIAGNOSIS — F988 Other specified behavioral and emotional disorders with onset usually occurring in childhood and adolescence: Secondary | ICD-10-CM

## 2022-03-24 MED ORDER — AMPHETAMINE-DEXTROAMPHET ER 20 MG PO CP24
20.0000 mg | ORAL_CAPSULE | ORAL | 0 refills | Status: DC
  Filled 2022-03-24: qty 30, 30d supply, fill #0

## 2022-03-24 MED ORDER — AMPHETAMINE-DEXTROAMPHET ER 10 MG PO CP24
20.0000 mg | ORAL_CAPSULE | Freq: Every morning | ORAL | 0 refills | Status: DC
  Filled 2022-03-24: qty 60, 30d supply, fill #0

## 2022-03-24 NOTE — Telephone Encounter (Signed)
Requesting: Adderall XR ?Contract: 01/22/2022 ?UDS: 01/22/2022 ?Last OV: 01/22/2022 ?Next OV: N/A ?Last Refill: 02/19/2022, #60--0 RF ?Database: ? ? ?Please advise  ? ?

## 2022-04-22 ENCOUNTER — Other Ambulatory Visit: Payer: Self-pay | Admitting: Family Medicine

## 2022-04-22 ENCOUNTER — Other Ambulatory Visit (HOSPITAL_BASED_OUTPATIENT_CLINIC_OR_DEPARTMENT_OTHER): Payer: Self-pay

## 2022-04-22 MED ORDER — AMPHETAMINE-DEXTROAMPHET ER 20 MG PO CP24
20.0000 mg | ORAL_CAPSULE | ORAL | 0 refills | Status: DC
  Filled 2022-04-22: qty 30, 30d supply, fill #0

## 2022-04-22 NOTE — Telephone Encounter (Signed)
Requesting: Adderall XR '20mg'$   ?Contract: 02/20/21 ?UDS: 01/22/22 ?Last Visit: 01/22/22  ?Next Visit: None ?Last Refill: 03/24/22 #30 and 0RF ? ?Please Advise ? ?

## 2022-05-26 ENCOUNTER — Encounter: Payer: Self-pay | Admitting: Family Medicine

## 2022-05-26 ENCOUNTER — Other Ambulatory Visit: Payer: Self-pay | Admitting: Family Medicine

## 2022-05-26 ENCOUNTER — Other Ambulatory Visit (HOSPITAL_BASED_OUTPATIENT_CLINIC_OR_DEPARTMENT_OTHER): Payer: Self-pay

## 2022-05-26 MED ORDER — AMPHETAMINE-DEXTROAMPHET ER 20 MG PO CP24
20.0000 mg | ORAL_CAPSULE | ORAL | 0 refills | Status: DC
Start: 2022-05-26 — End: 2022-06-28
  Filled 2022-05-26: qty 30, 30d supply, fill #0

## 2022-05-26 NOTE — Telephone Encounter (Signed)
Requesting: Adderall XR '20mg'$   Contract: 02/20/22 UDS: 01/22/22 Last Visit: 05/28/21  Next Visit: None Last Refill: 04/22/22 #30 and 0RF  Please Advise

## 2022-06-07 ENCOUNTER — Ambulatory Visit: Payer: BC Managed Care – PPO | Admitting: Family Medicine

## 2022-06-07 ENCOUNTER — Encounter: Payer: Self-pay | Admitting: Family Medicine

## 2022-06-07 VITALS — BP 91/64 | HR 82 | Ht 63.0 in | Wt 134.8 lb

## 2022-06-07 DIAGNOSIS — R197 Diarrhea, unspecified: Secondary | ICD-10-CM | POA: Diagnosis not present

## 2022-06-08 ENCOUNTER — Other Ambulatory Visit: Payer: BC Managed Care – PPO

## 2022-06-08 DIAGNOSIS — R197 Diarrhea, unspecified: Secondary | ICD-10-CM | POA: Diagnosis not present

## 2022-06-08 LAB — CBC
HCT: 38.5 % (ref 36.0–46.0)
Hemoglobin: 12.9 g/dL (ref 12.0–15.0)
MCHC: 33.5 g/dL (ref 30.0–36.0)
MCV: 97.3 fl (ref 78.0–100.0)
Platelets: 295 10*3/uL (ref 150.0–400.0)
RBC: 3.95 Mil/uL (ref 3.87–5.11)
RDW: 13.6 % (ref 11.5–15.5)
WBC: 7.8 10*3/uL (ref 4.0–10.5)

## 2022-06-08 LAB — COMPREHENSIVE METABOLIC PANEL
ALT: 13 U/L (ref 0–35)
AST: 19 U/L (ref 0–37)
Albumin: 4.2 g/dL (ref 3.5–5.2)
Alkaline Phosphatase: 33 U/L — ABNORMAL LOW (ref 39–117)
BUN: 12 mg/dL (ref 6–23)
CO2: 23 mEq/L (ref 19–32)
Calcium: 9.1 mg/dL (ref 8.4–10.5)
Chloride: 105 mEq/L (ref 96–112)
Creatinine, Ser: 0.77 mg/dL (ref 0.40–1.20)
GFR: 91.98 mL/min (ref >60.00)
Glucose, Bld: 87 mg/dL (ref 70–99)
Potassium: 4.2 mEq/L (ref 3.5–5.1)
Sodium: 137 mEq/L (ref 135–145)
Total Bilirubin: 0.4 mg/dL (ref 0.2–1.2)
Total Protein: 6.7 g/dL (ref 6.0–8.3)

## 2022-06-09 LAB — C. DIFFICILE GDH AND TOXIN A/B
GDH ANTIGEN: NOT DETECTED
MICRO NUMBER:: 13578023
SPECIMEN QUALITY:: ADEQUATE
TOXIN A AND B: NOT DETECTED

## 2022-06-10 LAB — SALMONELLA/SHIGELLA CULT, CAMPY EIA AND SHIGA TOXIN RFL ECOLI
MICRO NUMBER: 13577971
MICRO NUMBER:: 13577972
MICRO NUMBER:: 13577973
Result:: NOT DETECTED
SHIGA RESULT:: NOT DETECTED
SPECIMEN QUALITY: ADEQUATE
SPECIMEN QUALITY:: ADEQUATE
SPECIMEN QUALITY:: ADEQUATE

## 2022-06-16 ENCOUNTER — Other Ambulatory Visit: Payer: Self-pay | Admitting: *Deleted

## 2022-06-16 ENCOUNTER — Encounter: Payer: Self-pay | Admitting: Family Medicine

## 2022-06-16 DIAGNOSIS — R197 Diarrhea, unspecified: Secondary | ICD-10-CM

## 2022-06-16 NOTE — Progress Notes (Signed)
GI referral placed

## 2022-06-28 ENCOUNTER — Other Ambulatory Visit: Payer: Self-pay | Admitting: Family Medicine

## 2022-06-28 ENCOUNTER — Other Ambulatory Visit (HOSPITAL_BASED_OUTPATIENT_CLINIC_OR_DEPARTMENT_OTHER): Payer: Self-pay

## 2022-06-28 MED ORDER — AMPHETAMINE-DEXTROAMPHET ER 20 MG PO CP24
20.0000 mg | ORAL_CAPSULE | ORAL | 0 refills | Status: DC
Start: 2022-06-28 — End: 2022-07-30
  Filled 2022-06-28: qty 30, 30d supply, fill #0

## 2022-06-28 NOTE — Telephone Encounter (Signed)
Requesting:adderall xr 20 mg Contract:unknown UDS:01/22/22 Last Visit:06/07/22 Next Visit:unknown Last Refill:05/26/22  Please Advise

## 2022-07-28 ENCOUNTER — Encounter: Payer: Self-pay | Admitting: Gastroenterology

## 2022-07-28 NOTE — Telephone Encounter (Signed)
.  A user error has taken place: error

## 2022-07-29 ENCOUNTER — Other Ambulatory Visit (HOSPITAL_BASED_OUTPATIENT_CLINIC_OR_DEPARTMENT_OTHER): Payer: Self-pay | Admitting: Family Medicine

## 2022-07-29 DIAGNOSIS — Z1231 Encounter for screening mammogram for malignant neoplasm of breast: Secondary | ICD-10-CM

## 2022-07-30 ENCOUNTER — Other Ambulatory Visit: Payer: Self-pay | Admitting: Family Medicine

## 2022-07-30 ENCOUNTER — Other Ambulatory Visit (HOSPITAL_BASED_OUTPATIENT_CLINIC_OR_DEPARTMENT_OTHER): Payer: Self-pay

## 2022-07-30 MED ORDER — AMPHETAMINE-DEXTROAMPHET ER 20 MG PO CP24
20.0000 mg | ORAL_CAPSULE | ORAL | 0 refills | Status: DC
  Filled 2022-07-30: qty 30, 30d supply, fill #0

## 2022-07-30 NOTE — Telephone Encounter (Signed)
Requesting: Adderall XR '20mg'$   Contract: 02/20/21 UDS: 01/22/22  Last Visit: 06/07/22 Next Visit: None Last Refill: 06/28/22 #30 and 0RF  Please Advise

## 2022-08-03 ENCOUNTER — Ambulatory Visit (HOSPITAL_BASED_OUTPATIENT_CLINIC_OR_DEPARTMENT_OTHER)
Admission: RE | Admit: 2022-08-03 | Discharge: 2022-08-03 | Disposition: A | Discharge status: Home / Self Care | Payer: BC Managed Care – PPO | Source: Ambulatory Visit | Attending: Family Medicine | Admitting: Family Medicine

## 2022-08-03 DIAGNOSIS — Z1231 Encounter for screening mammogram for malignant neoplasm of breast: Secondary | ICD-10-CM | POA: Insufficient documentation

## 2022-08-05 ENCOUNTER — Other Ambulatory Visit: Payer: Self-pay | Admitting: Family Medicine

## 2022-08-05 DIAGNOSIS — R928 Other abnormal and inconclusive findings on diagnostic imaging of breast: Secondary | ICD-10-CM

## 2022-08-17 ENCOUNTER — Ambulatory Visit
Admission: RE | Admit: 2022-08-17 | Discharge: 2022-08-17 | Disposition: A | Discharge status: Home / Self Care | Payer: BC Managed Care – PPO | Source: Ambulatory Visit | Attending: Family Medicine | Admitting: Family Medicine

## 2022-08-17 ENCOUNTER — Other Ambulatory Visit: Payer: Self-pay | Admitting: Family Medicine

## 2022-08-17 DIAGNOSIS — N6323 Unspecified lump in the left breast, lower outer quadrant: Secondary | ICD-10-CM

## 2022-08-17 DIAGNOSIS — R928 Other abnormal and inconclusive findings on diagnostic imaging of breast: Secondary | ICD-10-CM

## 2022-08-26 ENCOUNTER — Other Ambulatory Visit: Payer: BC Managed Care – PPO

## 2022-08-30 ENCOUNTER — Other Ambulatory Visit: Payer: Self-pay | Admitting: Family Medicine

## 2022-08-31 ENCOUNTER — Other Ambulatory Visit (HOSPITAL_BASED_OUTPATIENT_CLINIC_OR_DEPARTMENT_OTHER): Payer: Self-pay

## 2022-08-31 MED ORDER — AMPHETAMINE-DEXTROAMPHET ER 20 MG PO CP24
20.0000 mg | ORAL_CAPSULE | ORAL | 0 refills | Status: DC
  Filled 2022-08-31: qty 30, 30d supply, fill #0

## 2022-08-31 NOTE — Telephone Encounter (Signed)
Pt travelling this week, requesting early refill if possible.   Requesting: Adderall XR '20mg'$   Contract: 02/20/21 UDS: 01/22/22 Last Visit: 06/07/22 Next Visit: None Last Refill: 07/30/22 #30 and 0RF  Please Advise

## 2022-09-02 ENCOUNTER — Other Ambulatory Visit: Payer: BC Managed Care – PPO

## 2022-09-06 ENCOUNTER — Other Ambulatory Visit: Payer: BC Managed Care – PPO

## 2022-09-14 ENCOUNTER — Encounter: Payer: Self-pay | Admitting: Gastroenterology

## 2022-09-14 ENCOUNTER — Ambulatory Visit: Payer: BC Managed Care – PPO | Admitting: Gastroenterology

## 2022-09-14 ENCOUNTER — Other Ambulatory Visit (INDEPENDENT_AMBULATORY_CARE_PROVIDER_SITE_OTHER): Payer: BC Managed Care – PPO

## 2022-09-14 ENCOUNTER — Other Ambulatory Visit (HOSPITAL_BASED_OUTPATIENT_CLINIC_OR_DEPARTMENT_OTHER): Payer: Self-pay

## 2022-09-14 VITALS — BP 110/79 | HR 114 | Ht 63.0 in | Wt 129.0 lb

## 2022-09-14 DIAGNOSIS — R197 Diarrhea, unspecified: Secondary | ICD-10-CM

## 2022-09-14 LAB — TSH: TSH: 2.22 u[IU]/mL (ref 0.35–5.50)

## 2022-09-14 MED ORDER — NA SULFATE-K SULFATE-MG SULF 17.5-3.13-1.6 GM/177ML PO SOLN
1.0000 | Freq: Once | ORAL | 0 refills | Status: AC
  Filled 2022-09-14: qty 354, 1d supply, fill #0

## 2022-09-14 NOTE — Progress Notes (Signed)
Assessment    Diarrhea.  R/O postinfectious diarrhea. :ess likely IBD or microscopic colitis or colorectal neoplasms.  Recommendations   GI stool profile, tTG, IgA Schedule colonoscopy. The risks (including bleeding, perforation, infection, missed lesions, medication reactions and possible hospitalization or surgery if complications occur), benefits, and alternatives to colonoscopy with possible biopsy and possible polypectomy were discussed with the patient and they consent to proceed.      HPI   Chief complaint: Diarrhea  Patient profile:  Nicole Drake is a 46 y.o. female referred by Garnet Koyanagi, DO for diarrhea.  She relates the sudden onset of diarrhea about 3 months ago with no precipitating event such as travel, diet change or antibiotic usage.  She was having about 10 bowel movements daily for a few weeks and this has gradually improved over time to about 3 bowel movements per day described as soft or loose.  Her prior bowel habits were mild constipation which was controlled with fiber and probiotics.  C diff and enteric pathogens were negative in June.  CBC, CMP were normal in June.  No prior colonoscopy. Denies weight loss, abdominal pain, constipation, change in stool caliber, melena, hematochezia, nausea, vomiting, dysphagia, reflux symptoms, chest pain.   Previous Labs / Imaging::    Latest Ref Rng & Units 06/07/2022    2:58 PM 02/20/2021    4:07 PM 10/21/2020    4:08 PM  CBC  WBC 4.0 - 10.5 K/uL 7.8  7.6  7.0   Hemoglobin 12.0 - 15.0 g/dL 12.9  13.6  12.9   Hematocrit 36.0 - 46.0 % 38.5  39.8  37.7   Platelets 150.0 - 400.0 K/uL 295.0  307  251.0     No results found for: "LIPASE"    Latest Ref Rng & Units 06/07/2022    2:58 PM 02/20/2021    4:07 PM 11/29/2013   11:28 AM  CMP  Glucose 70 - 99 mg/dL 87  77  79   BUN 6 - 23 mg/dL '12  14  13   '$ Creatinine 0.40 - 1.20 mg/dL 0.77  0.73  0.7   Sodium 135 - 145 mEq/L 137  137  139   Potassium 3.5 - 5.1 mEq/L 4.2   3.9  3.7   Chloride 96 - 112 mEq/L 105  101  106   CO2 19 - 32 mEq/L '23  25  26   '$ Calcium 8.4 - 10.5 mg/dL 9.1  9.2  9.1   Total Protein 6.0 - 8.3 g/dL 6.7  7.1  7.5   Total Bilirubin 0.2 - 1.2 mg/dL 0.4  0.4  0.6   Alkaline Phos 39 - 117 U/L 33   37   AST 0 - 37 U/L '19  19  20   '$ ALT 0 - 35 U/L '13  18  15      '$ Previous GI evaluation    Endoscopies:  None  Imaging:     History reviewed. No pertinent past medical history. Past Surgical History:  Procedure Laterality Date   AUGMENTATION MAMMAPLASTY Bilateral    CERVIX SURGERY     partial remove/age 37   OVARIAN CYST REMOVAL     age 56, 69, 57   TONSILLECTOMY     age 65   Family History  Problem Relation Age of Onset   Heart disease Father 45       MI   Ovarian cancer Maternal Grandmother    Diabetes Maternal Grandmother    Heart disease Maternal  Grandfather 47       MI   Heart disease Paternal Grandfather 40       MI   Social History   Tobacco Use   Smoking status: Former    Packs/day: 0.25    Years: 18.00    Total pack years: 4.50    Types: Cigarettes    Quit date: 08/23/2016    Years since quitting: 6.0   Smokeless tobacco: Never  Vaping Use   Vaping Use: Former  Substance Use Topics   Alcohol use: Yes    Comment: once  a month   Drug use: No   Current Outpatient Medications  Medication Sig Dispense Refill   amphetamine-dextroamphetamine (ADDERALL XR) 20 MG 24 hr capsule Take 1 capsule (20 mg total) by mouth every morning. 30 capsule 0   No current facility-administered medications for this visit.   Allergies  Allergen Reactions   Penicillins Rash    Review of Systems: All other systems reviewed and negative except where noted in HPI.    Physical Exam    Wt Readings from Last 3 Encounters:  09/14/22 129 lb (58.5 kg)  06/07/22 134 lb 12.8 oz (61.1 kg)  01/22/22 141 lb 6.4 oz (64.1 kg)    BP 110/79   Pulse (!) 114   Ht '5\' 3"'$  (1.6 m)   Wt 129 lb (58.5 kg)   BMI 22.85 kg/m   Constitutional:  Generally well appearing female in no acute distress. Psychiatric: Pleasant. Normal mood and affect. Behavior is normal. HEENT: Pupils normal.  Conjunctivae are normal. No scleral icterus. Neck supple.  Cardiovascular: Normal rate, regular rhythm. No edema Pulmonary/chest: Effort normal and breath sounds normal. No wheezing, rales or rhonchi. Abdominal: Soft, nondistended, minimal suprapubic tenderness to deep palpation. Bowel sounds active throughout. There are no masses palpable. No hepatomegaly. Rectal: Deferred to colonoscopy Neurological: Alert and oriented to person place and time. Skin: Skin is warm and dry. No rashes noted.  Lucio Edward, MD   cc:  Referring Provider Carollee Herter, Alferd Apa, *

## 2022-09-14 NOTE — Patient Instructions (Signed)
Your provider has requested that you go to the basement level for lab work before leaving today. Press "B" on the elevator. The lab is located at the first door on the left as you exit the elevator. ° °You have been scheduled for a colonoscopy. Please follow written instructions given to you at your visit today.  °Please pick up your prep supplies at the pharmacy within the next 1-3 days. °If you use inhalers (even only as needed), please bring them with you on the day of your procedure. ° °The [facility] providers would like to encourage you to use MYCHART to communicate with providers for non-urgent requests or questions.  Due to long hold times on the telephone, sending your provider a message by MYCHART may be a faster and more efficient way to get a response.  Please allow 48 business hours for a response.  Please remember that this is for non-urgent requests.  ° °Due to recent changes in healthcare laws, you may see the results of your imaging and laboratory studies on MyChart before your provider has had a chance to review them.  We understand that in some cases there may be results that are confusing or concerning to you. Not all laboratory results come back in the same time frame and the provider may be waiting for multiple results in order to interpret others.  Please give us 48 hours in order for your provider to thoroughly review all the results before contacting the office for clarification of your results.  ° °Thank you for choosing me and [facility]. ° °Malcolm T. Stark, Jr., MD., FACG ° ° °

## 2022-09-15 ENCOUNTER — Ambulatory Visit
Admission: RE | Admit: 2022-09-15 | Discharge: 2022-09-15 | Disposition: A | Discharge status: Home / Self Care | Payer: BC Managed Care – PPO | Source: Ambulatory Visit | Attending: Family Medicine | Admitting: Family Medicine

## 2022-09-15 DIAGNOSIS — N6323 Unspecified lump in the left breast, lower outer quadrant: Secondary | ICD-10-CM

## 2022-09-15 DIAGNOSIS — N6012 Diffuse cystic mastopathy of left breast: Secondary | ICD-10-CM | POA: Diagnosis not present

## 2022-09-15 HISTORY — PX: BREAST BIOPSY: SHX20

## 2022-09-15 LAB — IGA: Immunoglobulin A: 146 mg/dL (ref 47–310)

## 2022-09-15 LAB — TISSUE TRANSGLUTAMINASE, IGA: (tTG) Ab, IgA: <1 U/mL

## 2022-09-29 DIAGNOSIS — Z1389 Encounter for screening for other disorder: Secondary | ICD-10-CM | POA: Diagnosis not present

## 2022-09-29 DIAGNOSIS — Z01419 Encounter for gynecological examination (general) (routine) without abnormal findings: Secondary | ICD-10-CM | POA: Diagnosis not present

## 2022-09-29 DIAGNOSIS — N951 Menopausal and female climacteric states: Secondary | ICD-10-CM | POA: Diagnosis not present

## 2022-09-29 DIAGNOSIS — Z113 Encounter for screening for infections with a predominantly sexual mode of transmission: Secondary | ICD-10-CM | POA: Diagnosis not present

## 2022-09-29 DIAGNOSIS — R197 Diarrhea, unspecified: Secondary | ICD-10-CM | POA: Diagnosis not present

## 2022-09-30 ENCOUNTER — Other Ambulatory Visit: Payer: Self-pay | Admitting: Family Medicine

## 2022-09-30 ENCOUNTER — Other Ambulatory Visit (HOSPITAL_BASED_OUTPATIENT_CLINIC_OR_DEPARTMENT_OTHER): Payer: Self-pay

## 2022-09-30 MED ORDER — AMPHETAMINE-DEXTROAMPHET ER 20 MG PO CP24
20.0000 mg | ORAL_CAPSULE | ORAL | 0 refills | Status: DC
  Filled 2022-09-30: qty 30, 30d supply, fill #0

## 2022-10-15 ENCOUNTER — Encounter: Payer: Self-pay | Admitting: Gastroenterology

## 2022-10-16 ENCOUNTER — Encounter: Payer: Self-pay | Admitting: Certified Registered Nurse Anesthetist

## 2022-10-18 ENCOUNTER — Encounter: Payer: Self-pay | Admitting: Gastroenterology

## 2022-10-20 ENCOUNTER — Telehealth: Payer: Self-pay | Admitting: Gastroenterology

## 2022-10-20 NOTE — Telephone Encounter (Signed)
The colonoscopy was scheduled for evaluation of diarrhea so it is a diagnostic procedure. Please charge her for late Napoleon cancellation

## 2022-10-20 NOTE — Telephone Encounter (Signed)
Good Morning Dr. Fuller Plan,   Patient called stating that she needed to cancel her procedure for her colonoscopy for tomorrow at 2:30 due to patient stating she is not going to be billed for a diagnostic procedure.

## 2022-10-21 ENCOUNTER — Encounter: Payer: BC Managed Care – PPO | Admitting: Gastroenterology

## 2022-11-01 ENCOUNTER — Other Ambulatory Visit: Payer: Self-pay | Admitting: Family Medicine

## 2022-11-02 ENCOUNTER — Other Ambulatory Visit (HOSPITAL_BASED_OUTPATIENT_CLINIC_OR_DEPARTMENT_OTHER): Payer: Self-pay

## 2022-11-02 MED ORDER — AMPHETAMINE-DEXTROAMPHET ER 20 MG PO CP24
20.0000 mg | ORAL_CAPSULE | ORAL | 0 refills | Status: DC
  Filled 2022-11-02: qty 30, 30d supply, fill #0

## 2022-11-02 NOTE — Telephone Encounter (Signed)
Requesting: Adderall XR '20mg'$   Contract: 02/20/21 UDS: 01/22/22 Last Visit: 06/07/22 Next Visit: None Last Refill: 09/30/22 #30 and 0RF   Please Advise

## 2022-11-23 ENCOUNTER — Encounter: Payer: Self-pay | Admitting: Family Medicine

## 2022-12-03 ENCOUNTER — Other Ambulatory Visit: Payer: Self-pay | Admitting: Family Medicine

## 2022-12-03 ENCOUNTER — Other Ambulatory Visit (HOSPITAL_BASED_OUTPATIENT_CLINIC_OR_DEPARTMENT_OTHER): Payer: Self-pay

## 2022-12-03 MED ORDER — AMPHETAMINE-DEXTROAMPHET ER 20 MG PO CP24
20.0000 mg | ORAL_CAPSULE | ORAL | 0 refills | Status: DC
  Filled 2022-12-03: qty 30, 30d supply, fill #0

## 2022-12-03 NOTE — Telephone Encounter (Signed)
Prescription Request  12/03/2022  Is this a "Controlled Substance" medicine? Yes  LOV: Visit date not found  What is the name of the medication or equipment?   Rx #: [id number]  amphetamine-dextroamphetamine (ADDERALL XR) 20 MG 24 hr capsule [[id number]]   Have you contacted your pharmacy to request a refill? No   Which pharmacy would you like this sent to?  [facility] [address], [place] [address] Phone: [phone] Fax: [phone]    Patient notified that their request is being sent to the clinical staff for review and that they should receive a response within 2 business days.   Please advise at Mobile [phone] (mobile)

## 2022-12-03 NOTE — Telephone Encounter (Signed)
Requesting: Adderall XR '20mg'$   Contract: 02/20/21 UDS: 01/22/22 Last Visit: 06/07/22 w/ Lovena Le Next Visit: None Last Refill: 11/02/22 #30 and 0RF   Please Advise

## 2023-01-04 ENCOUNTER — Other Ambulatory Visit (HOSPITAL_BASED_OUTPATIENT_CLINIC_OR_DEPARTMENT_OTHER): Payer: Self-pay

## 2023-01-04 ENCOUNTER — Other Ambulatory Visit: Payer: Self-pay | Admitting: Family Medicine

## 2023-01-06 ENCOUNTER — Other Ambulatory Visit (HOSPITAL_BASED_OUTPATIENT_CLINIC_OR_DEPARTMENT_OTHER): Payer: Self-pay

## 2023-01-06 ENCOUNTER — Other Ambulatory Visit: Payer: Self-pay | Admitting: Family Medicine

## 2023-01-06 MED ORDER — AMPHETAMINE-DEXTROAMPHET ER 20 MG PO CP24
20.0000 mg | ORAL_CAPSULE | ORAL | 0 refills | Status: DC
  Filled 2023-01-06: qty 30, 30d supply, fill #0

## 2023-02-01 ENCOUNTER — Encounter: Payer: Self-pay | Admitting: Family Medicine

## 2023-02-01 ENCOUNTER — Ambulatory Visit: Payer: BC Managed Care – PPO | Admitting: Family Medicine

## 2023-02-01 ENCOUNTER — Other Ambulatory Visit (HOSPITAL_BASED_OUTPATIENT_CLINIC_OR_DEPARTMENT_OTHER): Payer: Self-pay

## 2023-02-01 VITALS — BP 104/80 | HR 96 | Temp 98.2°F | Resp 18 | Ht 63.0 in | Wt 119.4 lb

## 2023-02-01 DIAGNOSIS — Z79899 Other long term (current) drug therapy: Secondary | ICD-10-CM

## 2023-02-01 DIAGNOSIS — Z1211 Encounter for screening for malignant neoplasm of colon: Secondary | ICD-10-CM | POA: Diagnosis not present

## 2023-02-01 DIAGNOSIS — R1013 Epigastric pain: Secondary | ICD-10-CM | POA: Diagnosis not present

## 2023-02-01 DIAGNOSIS — F988 Other specified behavioral and emotional disorders with onset usually occurring in childhood and adolescence: Secondary | ICD-10-CM | POA: Diagnosis not present

## 2023-02-01 MED ORDER — AMPHETAMINE-DEXTROAMPHET ER 20 MG PO CP24
20.0000 mg | ORAL_CAPSULE | ORAL | 0 refills | Status: DC
  Filled 2023-02-01 – 2023-04-13 (×2): qty 30, 30d supply, fill #0

## 2023-02-01 MED ORDER — FAMOTIDINE 20 MG PO TABS
20.0000 mg | ORAL_TABLET | Freq: Two times a day (BID) | ORAL | 2 refills | Status: AC
  Filled 2023-02-01: qty 60, 30d supply, fill #0

## 2023-02-01 MED ORDER — AMPHETAMINE-DEXTROAMPHET ER 20 MG PO CP24
20.0000 mg | ORAL_CAPSULE | ORAL | 0 refills | Status: DC
  Filled 2023-02-01 – 2023-02-08 (×2): qty 30, 30d supply, fill #0

## 2023-02-01 MED ORDER — AMPHETAMINE-DEXTROAMPHET ER 20 MG PO CP24
20.0000 mg | ORAL_CAPSULE | ORAL | 0 refills | Status: DC
  Filled 2023-02-01: qty 30, 30d supply, fill #0
  Filled ????-??-??: fill #0

## 2023-02-01 NOTE — Progress Notes (Signed)
Subjective:   By signing my name below, I, [name], attest that this documentation has been prepared under the direction and in the presence of Ann Held, DO. 02/01/2023   Patient ID: Nicole Drake, female    DOB: December 20, 1975, 47 y.o.   MRN: [id number]  Chief Complaint  Patient presents with   ADD   Follow-up    HPI Patient is in today for a follow up visit.   She complains of episodes of upper middle abdominal pain. She notices her symptoms present when eating out somewhere she does not eat during her normal routine. She is taking OTC TUMS to manage her symptoms and finds temporary relief. She notices her episodes are having more frequently. She has no pain at this time. Her pain does not wake her up at night.  She is requesting a refill for 20 mg Adderall daily PO. No problems with the adderall.  It is working well.   She has an upcomming colonoscopy next month.   No past medical history on file.  Past Surgical History:  Procedure Laterality Date   AUGMENTATION MAMMAPLASTY Bilateral    BREAST BIOPSY Left 09/15/2022   CERVIX SURGERY     partial remove/age 44   OVARIAN CYST REMOVAL     age 49, 47, 77   TONSILLECTOMY     age 17    Family History  Problem Relation Age of Onset   Heart disease Father 23       MI   Ovarian cancer Maternal Grandmother    Diabetes Maternal Grandmother    Heart disease Maternal Grandfather 51       MI   Heart disease Paternal Grandfather 80       MI    Social History   Socioeconomic History   Marital status: Divorced    Spouse name: Not on file   Number of children: 2   Years of education: Not on file   Highest education level: Not on file  Occupational History   Occupation: Theatre stage manager  Tobacco Use   Smoking status: Former    Packs/day: 0.25    Years: 18.00    Total pack years: 4.50    Types: Cigarettes    Quit date: 08/23/2016    Years since quitting: 6.4   Smokeless tobacco: Never  Vaping Use    Vaping Use: Former  Substance and Sexual Activity   Alcohol use: Yes    Comment: once  a month   Drug use: No   Sexual activity: Yes  Other Topics Concern   Not on file  Social History Narrative   Not on file   Social Determinants of Health   Financial Resource Strain: Not on file  Food Insecurity: Not on file  Transportation Needs: Not on file  Physical Activity: Not on file  Stress: Not on file  Social Connections: Not on file  Intimate Partner Violence: Not on file    Outpatient Medications Prior to Visit  Medication Sig Dispense Refill   amphetamine-dextroamphetamine (ADDERALL XR) 20 MG 24 hr capsule Take 1 capsule (20 mg total) by mouth every morning. 30 capsule 0   No facility-administered medications prior to visit.    Allergies  Allergen Reactions   Penicillins Rash    Review of Systems  Constitutional:  Negative for fever and malaise/fatigue.  HENT:  Negative for congestion.   Eyes:  Negative for blurred vision.  Respiratory:  Negative for cough and shortness of breath.  Cardiovascular:  Negative for chest pain, palpitations and leg swelling.  Gastrointestinal:  Positive for abdominal pain (upper abdomnial pain). Negative for vomiting.  Musculoskeletal:  Negative for back pain.  Skin:  Negative for rash.  Neurological:  Negative for loss of consciousness and headaches.  Psychiatric/Behavioral: Negative.  The patient is not nervous/anxious.        Objective:    Physical Exam Vitals and nursing note reviewed.  Constitutional:      General: She is not in acute distress.    Appearance: Normal appearance. She is not ill-appearing.  HENT:     Head: Normocephalic and atraumatic.     Right Ear: External ear normal.     Left Ear: External ear normal.  Eyes:     Extraocular Movements: Extraocular movements intact.     Pupils: Pupils are equal, round, and reactive to light.  Cardiovascular:     Rate and Rhythm: Normal rate and regular rhythm.     Heart  sounds: Normal heart sounds. No murmur heard.    No gallop.  Pulmonary:     Effort: Pulmonary effort is normal. No respiratory distress.     Breath sounds: Normal breath sounds. No wheezing or rales.  Abdominal:     Palpations: Abdomen is soft.     Tenderness: There is no abdominal tenderness. There is no guarding or rebound.  Skin:    General: Skin is warm and dry.  Neurological:     General: No focal deficit present.     Mental Status: She is alert and oriented to person, place, and time.  Psychiatric:        Judgment: Judgment normal.     BP 104/80 (BP Location: Left Arm, Patient Position: Sitting, Cuff Size: Normal)   Pulse 96   Temp 98.2 F (36.8 C) (Oral)   Resp 18   Ht 5' 3"$  (1.6 m)   Wt 119 lb 6.4 oz (54.2 kg)   SpO2 100%   BMI 21.15 kg/m  Wt Readings from Last 3 Encounters:  02/01/23 119 lb 6.4 oz (54.2 kg)  09/14/22 129 lb (58.5 kg)  06/07/22 134 lb 12.8 oz (61.1 kg)       Assessment & Plan:  Attention deficit disorder, unspecified hyperactivity presence Assessment & Plan: Stable  No side effects from adderall  Refill 3 months  Return to office 6 months  Update uds and contract Database reviewed   Orders: -     Amphetamine-Dextroamphet ER; Take 1 capsule (20 mg total) by mouth every morning.  Dispense: 30 capsule; Refill: 0 -     Amphetamine-Dextroamphet ER; Take 1 capsule (20 mg total) by mouth every morning.  Dispense: 30 capsule; Refill: 0 -     Drug Monitoring Panel [id number] , Urine  Dyspepsia Assessment & Plan: Ho on foods to avoid given to pt  Pepcid bid  If no relief ---  GI  Orders: -     Famotidine; Take 1 tablet (20 mg total) by mouth 2 (two) times daily.  Dispense: 60 tablet; Refill: 2 -     H. pylori breath test -     CBC with Differential/Platelet; Future -     Comprehensive metabolic panel; Future -     Lipid panel; Future -     TSH; Future  Colon cancer screening -     Ambulatory referral to Gastroenterology  High risk  medication use -     Drug Monitoring Panel [id number] , Urine    I, Alferd Apa  Carollee Herter, DO, personally preformed the services described in this documentation.  All medical record entries made by the scribe were at my direction and in my presence.  I have reviewed the chart and discharge instructions (if applicable) and agree that the record reflects my personal performance and is accurate and complete. 02/01/2023   I,[name],acting as a scribe for Ann Held, DO.,have documented all relevant documentation on the behalf of Ann Held, DO,as directed by  Ann Held, DO while in the presence of Ann Held, DO.   Ann Held, DO

## 2023-02-02 DIAGNOSIS — R1013 Epigastric pain: Secondary | ICD-10-CM | POA: Insufficient documentation

## 2023-02-02 NOTE — Assessment & Plan Note (Signed)
Ho on foods to avoid given to pt  Pepcid bid  If no relief ---  GI

## 2023-02-02 NOTE — Assessment & Plan Note (Signed)
Stable  No side effects from adderall  Refill 3 months  Return to office 6 months  Update uds and contract Database reviewed

## 2023-02-03 LAB — DM TEMPLATE

## 2023-02-04 LAB — DRUG MONITORING PANEL 376104, URINE
Amphetamine: 3391 ng/mL — ABNORMAL HIGH (ref <250)
Amphetamines: POSITIVE ng/mL — AB (ref <500)
Barbiturates: NEGATIVE ng/mL (ref <300)
Benzodiazepines: NEGATIVE ng/mL (ref <100)
Cocaine Metabolite: NEGATIVE ng/mL (ref <150)
Desmethyltramadol: NEGATIVE ng/mL (ref <100)
Methamphetamine: NEGATIVE ng/mL (ref <250)
Opiates: NEGATIVE ng/mL (ref <100)
Oxycodone: NEGATIVE ng/mL (ref <100)
Tramadol: NEGATIVE ng/mL (ref <100)

## 2023-02-04 LAB — DM TEMPLATE

## 2023-02-07 LAB — H. PYLORI BREATH TEST: H. pylori Breath Test: NOT DETECTED

## 2023-02-08 ENCOUNTER — Other Ambulatory Visit (HOSPITAL_BASED_OUTPATIENT_CLINIC_OR_DEPARTMENT_OTHER): Payer: Self-pay

## 2023-02-08 ENCOUNTER — Other Ambulatory Visit: Payer: Self-pay | Admitting: Family Medicine

## 2023-02-08 MED ORDER — AMPHETAMINE-DEXTROAMPHET ER 20 MG PO CP24
20.0000 mg | ORAL_CAPSULE | ORAL | 0 refills | Status: DC
  Filled 2023-02-08: qty 30, 30d supply, fill #0

## 2023-02-08 NOTE — Telephone Encounter (Signed)
Per pharmacy:  rxs sent for March & April, but need one for Feb please.

## 2023-02-09 ENCOUNTER — Other Ambulatory Visit (HOSPITAL_BASED_OUTPATIENT_CLINIC_OR_DEPARTMENT_OTHER): Payer: Self-pay

## 2023-02-28 ENCOUNTER — Other Ambulatory Visit (HOSPITAL_BASED_OUTPATIENT_CLINIC_OR_DEPARTMENT_OTHER): Payer: Self-pay

## 2023-02-28 ENCOUNTER — Encounter: Payer: Self-pay | Admitting: Family Medicine

## 2023-02-28 ENCOUNTER — Ambulatory Visit: Payer: BC Managed Care – PPO | Admitting: Family Medicine

## 2023-02-28 VITALS — BP 108/80 | HR 80 | Temp 97.8°F | Resp 18 | Ht 63.0 in | Wt 119.4 lb

## 2023-02-28 DIAGNOSIS — M5442 Lumbago with sciatica, left side: Secondary | ICD-10-CM | POA: Diagnosis not present

## 2023-02-28 MED ORDER — CYCLOBENZAPRINE HCL 10 MG PO TABS
10.0000 mg | ORAL_TABLET | Freq: Three times a day (TID) | ORAL | 0 refills | Status: AC | PRN
  Filled 2023-02-28: qty 30, 10d supply, fill #0

## 2023-02-28 MED ORDER — HYDROCODONE-ACETAMINOPHEN 5-325 MG PO TABS
1.0000 | ORAL_TABLET | Freq: Four times a day (QID) | ORAL | 0 refills | Status: AC | PRN
  Filled 2023-02-28: qty 20, 5d supply, fill #0

## 2023-02-28 MED ORDER — PREDNISONE 10 MG PO TABS
ORAL_TABLET | ORAL | 0 refills | Status: DC
  Filled 2023-02-28: qty 20, 12d supply, fill #0

## 2023-02-28 NOTE — Patient Instructions (Signed)
Lumbosacral Strain A lumbosacral strain is an injury that causes pain in the lower back (lumbosacral spine). This injury usually happens from overstretching the muscles or ligaments along the spine. Ligaments are cord-like tissues that connect bones to each other. A strain can affect one or more muscles or ligaments. What are the causes? This condition may be caused by: A hard, direct hit to the back. Overstretching the lower back muscles. This may result from: A fall. Lifting something heavy. Repeated movements such as bending or crouching. What increases the risk? The following factors make you more likely to have a lumbosacral strain: Taking part in sports or activities that involve: A sudden twist of the back. Pushing or pulling motions. Being overweight or obese. Having poor strength and flexibility, especially tight hamstrings or weak muscles in the back or abdomen. Having too much of a curve in the lower back. Having a pelvis that is tilted forward. What are the signs or symptoms? The main symptom of this condition is pain in the lower back, at the site of the strain. Pain may also be felt down one or both legs. How is this diagnosed? This condition is diagnosed based on: Your symptoms and medical history. A physical exam. During the exam: Your health care provider may push on certain areas of your back to find the source of your pain. You may be asked to bend forward, backward, and side to side to check your pain and range of motion. You may also have imaging tests, such as X-rays and an MRI. How is this treated? This condition may be treated by: Applying heat and cold to the affected area. Taking medicines for pain and to relax your muscles. Taking NSAIDs, such as ibuprofen, to help reduce swelling and discomfort. Doing stretching and strengthening exercises for your lower back. Symptoms usually improve within several weeks of treatment. But recovery time varies. When your  symptoms improve, gradually return to your normal routine as soon as possible. This will help reduce pain, avoid stiffness, and keep muscle strength. Follow these instructions at home: Medicines Take over-the-counter and prescription medicines only as told by your health care provider. Ask your health care provider if the medicine prescribed to you: Requires you to avoid driving or using machinery. Can cause constipation. You may need to take these actions to prevent or treat constipation: Drink enough fluid to keep your urine pale yellow. Take over-the-counter or prescription medicines. Eat foods that are high in fiber, such as beans, whole grains, and fresh fruits and vegetables. Limit foods that are high in fat and processed sugars, such as fried or sweet foods. Managing pain, stiffness, and swelling     If told, put ice on the injured area. Put ice in a plastic bag. Place a towel between your skin and the bag. Leave the ice on for 20 minutes, 2-3 times a day. If told, apply heat to the affected area as often as told by your health care provider. Use the heat source that your health care provider recommends, such as a moist heat pack or a heating pad. Place a towel between your skin and the heat source. Leave the heat on for 20-30 minutes. If your skin turns bright red, remove the heat or ice right away to prevent skin damage. The risk of damage is higher if you cannot feel pain, heat, or cold. Activity Rest as told by your health care provider. Do not stay in bed. Staying in bed for more than 1-2 days   can delay your recovery. Return to your normal activities as told by your health care provider. Ask your health care provider what activities are safe for you. Avoid activities that take a lot of energy for as long as told by your health care provider. Do exercises as told by your health care provider. This includes stretching and strengthening exercises. General instructions      Use good posture when sitting and standing. Avoid leaning forward when you sit, and avoid hunching over when you stand. Do not use any products that contain nicotine or tobacco. These products include cigarettes, chewing tobacco, and vaping devices, such as e-cigarettes. If you need help quitting, ask your health care provider. How is this prevented?  Warm up properly before physical activity, and cool down and stretch after being active. Use correct form when playing sports. Bend your knees and use correct posture when lifting heavy objects. Maintain a healthy weight. Sleep on a mattress with medium firmness to support your back. Do at least 150 minutes of moderate-intensity exercise each week, such as brisk walking or water aerobics. Try a form of exercise that takes stress off your back, such as swimming or stationary cycling. Maintain physical fitness, including: Strength. Flexibility. Contact a health care provider if: Your back pain does not improve after several weeks of treatment. Your symptoms get worse. You have a fever. Get help right away if: Your back pain is severe. You cannot stand or walk. You feel nauseous or you vomit. You develop any of the following: Trouble controlling when you urinate or when you have a bowel movement. Pain in your legs. Your feet or legs get very cold, turn pale, or look blue. Weakness in your buttocks or legs. This information is not intended to replace advice given to you by your health care provider. Make sure you discuss any questions you have with your health care provider. Document Revised: 06/22/2022 Document Reviewed: 06/22/2022 [employer]vier Patient Education  2023 [employer]  

## 2023-02-28 NOTE — Progress Notes (Signed)
Subjective:   By signing my name below, I, [name], attest that this documentation has been prepared under the direction and in the presence of Ann Held, DO. 02/28/2023   Patient ID: Nicole Drake, female    DOB: 1976/08/15, 47 y.o.   MRN: [id number]  Chief Complaint  Patient presents with  . Back Pain    Pt states she was carrying some garden soil and felt a tinge on Saturday and then went for a hour and half drive.     Back Pain Associated symptoms include weakness. Pertinent negatives include no chest pain, fever or headaches.   Patient is in today for a office visit.   She complains of lower back pain for the past 3 days. She is having muscle spasm going down to her left buttocks. Her pain radiates down to her lower left buttocks. She was lifting heavy garden soil when she felt sharp pain in her back. She then rode in the car for 1 1/2 hours. She had similar pain 2 years ago and found she had a slipped disc in her spine.  No loss of bowel/bladder control.  Pain with walking and she can not get comfortable at night.      No past medical history on file.  Past Surgical History:  Procedure Laterality Date  . AUGMENTATION MAMMAPLASTY Bilateral   . BREAST BIOPSY Left 09/15/2022  . CERVIX SURGERY     partial remove/age 19  . OVARIAN CYST REMOVAL     age 44, 87, 47  . TONSILLECTOMY     age 60    Family History  Problem Relation Age of Onset  . Heart disease Father 52       MI  . Ovarian cancer Maternal Grandmother   . Diabetes Maternal Grandmother   . Heart disease Maternal Grandfather 80       MI  . Heart disease Paternal Grandfather 50       MI    Social History   Socioeconomic History  . Marital status: Divorced    Spouse name: Not on file  . Number of children: 2  . Years of education: Not on file  . Highest education level: Not on file  Occupational History  . Occupation: Theatre stage manager  Tobacco Use  . Smoking status: Former     Packs/day: 0.25    Years: 18.00    Additional pack years: 0.00    Total pack years: 4.50    Types: Cigarettes    Quit date: 08/23/2016    Years since quitting: 6.5  . Smokeless tobacco: Never  Vaping Use  . Vaping Use: Former  Substance and Sexual Activity  . Alcohol use: Yes    Comment: once  a month  . Drug use: No  . Sexual activity: Yes  Other Topics Concern  . Not on file  Social History Narrative  . Not on file   Social Determinants of Health   Financial Resource Strain: Not on file  Food Insecurity: Not on file  Transportation Needs: Not on file  Physical Activity: Not on file  Stress: Not on file  Social Connections: Not on file  Intimate Partner Violence: Not on file    Outpatient Medications Prior to Visit  Medication Sig Dispense Refill  . amphetamine-dextroamphetamine (ADDERALL XR) 20 MG 24 hr capsule Take 1 capsule (20 mg total) by mouth every morning. 30 capsule 0  . amphetamine-dextroamphetamine (ADDERALL XR) 20 MG 24 hr capsule Take 1 capsule (  20 mg total) by mouth every morning. 30 capsule 0  . amphetamine-dextroamphetamine (ADDERALL XR) 20 MG 24 hr capsule Take 1 capsule (20 mg total) by mouth every morning. 30 capsule 0  . famotidine (PEPCID) 20 MG tablet Take 1 tablet (20 mg total) by mouth 2 (two) times daily. 60 tablet 2   No facility-administered medications prior to visit.    Allergies  Allergen Reactions  . Penicillins Rash    Review of Systems  Constitutional:  Negative for fever and malaise/fatigue.  HENT:  Negative for congestion.   Eyes:  Negative for blurred vision.  Respiratory:  Negative for cough and shortness of breath.   Cardiovascular:  Negative for chest pain, palpitations and leg swelling.  Gastrointestinal:  Negative for vomiting.  Musculoskeletal:  Positive for back pain (lower back pain).       (+)pain radiates from lower back to left buttock  Skin:  Negative for rash.  Neurological:  Positive for weakness. Negative for  loss of consciousness and headaches.       Objective:    Physical Exam Vitals and nursing note reviewed.  Constitutional:      General: She is not in acute distress.    Appearance: Normal appearance. She is not ill-appearing.  HENT:     Head: Normocephalic and atraumatic.     Right Ear: External ear normal.     Left Ear: External ear normal.  Eyes:     Extraocular Movements: Extraocular movements intact.     Pupils: Pupils are equal, round, and reactive to light.  Cardiovascular:     Rate and Rhythm: Normal rate and regular rhythm.     Heart sounds: Normal heart sounds. No murmur heard.    No gallop.  Pulmonary:     Effort: Pulmonary effort is normal. No respiratory distress.     Breath sounds: Normal breath sounds. No wheezing or rales.  Musculoskeletal:     Comments: 3/5 Left hip flexure  4/5 Right hip flexure  2/5 left plantar flex  2/5 plantar flex of left great toe 5/5 strength plantar flex right 5/5 strength planter flex great toe  Skin:    General: Skin is warm and dry.  Neurological:     Mental Status: She is alert and oriented to person, place, and time.     Motor: Weakness present.     Gait: Gait abnormal.     Deep Tendon Reflexes: Reflexes normal.     Reflex Scores:      Patellar reflexes are 2+ on the right side and 2+ on the left side. Psychiatric:        Judgment: Judgment normal.    BP 108/80 (BP Location: Right Arm, Patient Position: Sitting, Cuff Size: Normal)   Pulse 80   Temp 97.8 F (36.6 C) (Oral)   Resp 18   Ht 5\' 3"  (1.6 m)   Wt 119 lb 6.4 oz (54.2 kg)   SpO2 98%   BMI 21.15 kg/m  Wt Readings from Last 3 Encounters:  02/28/23 119 lb 6.4 oz (54.2 kg)  02/01/23 119 lb 6.4 oz (54.2 kg)  09/14/22 129 lb (58.5 kg)       Assessment & Plan:  There are no diagnoses linked to this encounter.  I, Shehryar [place], personally preformed the services described in this documentation.  All medical record entries made by the scribe were at my  direction and in my presence.  I have reviewed the chart and discharge instructions (if applicable) and agree that  the record reflects my personal performance and is accurate and complete. 02/28/2023   I,[name],acting as a scribe for Ann Held, DO.,have documented all relevant documentation on the behalf of Ann Held, DO,as directed by  Ann Held, DO while in the presence of Ann Held, DO.   Shehryar [employer]

## 2023-03-11 DIAGNOSIS — D225 Melanocytic nevi of trunk: Secondary | ICD-10-CM | POA: Diagnosis not present

## 2023-03-11 DIAGNOSIS — S00412A Abrasion of left ear, initial encounter: Secondary | ICD-10-CM | POA: Diagnosis not present

## 2023-03-11 DIAGNOSIS — L814 Other melanin hyperpigmentation: Secondary | ICD-10-CM | POA: Diagnosis not present

## 2023-03-11 DIAGNOSIS — L821 Other seborrheic keratosis: Secondary | ICD-10-CM | POA: Diagnosis not present

## 2023-03-14 ENCOUNTER — Other Ambulatory Visit (HOSPITAL_BASED_OUTPATIENT_CLINIC_OR_DEPARTMENT_OTHER): Payer: Self-pay

## 2023-03-14 ENCOUNTER — Other Ambulatory Visit: Payer: Self-pay | Admitting: Family Medicine

## 2023-03-14 DIAGNOSIS — F988 Other specified behavioral and emotional disorders with onset usually occurring in childhood and adolescence: Secondary | ICD-10-CM

## 2023-03-14 MED ORDER — AMPHETAMINE-DEXTROAMPHET ER 20 MG PO CP24
20.0000 mg | ORAL_CAPSULE | ORAL | 0 refills | Status: DC
  Filled 2023-03-14: qty 30, 30d supply, fill #0

## 2023-03-14 NOTE — Telephone Encounter (Signed)
Requesting: Adderall XR 20mg   Contract: 02/01/23 UDS: 02/01/23 Last Visit: 02/28/23 Next Visit: None Last Refill: 02/01/23 #30 and 0RF (x2)  Please Advise

## 2023-03-22 ENCOUNTER — Other Ambulatory Visit (HOSPITAL_BASED_OUTPATIENT_CLINIC_OR_DEPARTMENT_OTHER): Payer: Self-pay

## 2023-03-31 ENCOUNTER — Other Ambulatory Visit (HOSPITAL_BASED_OUTPATIENT_CLINIC_OR_DEPARTMENT_OTHER): Payer: Self-pay

## 2023-04-01 ENCOUNTER — Other Ambulatory Visit (HOSPITAL_BASED_OUTPATIENT_CLINIC_OR_DEPARTMENT_OTHER): Payer: Self-pay

## 2023-04-04 ENCOUNTER — Other Ambulatory Visit (HOSPITAL_BASED_OUTPATIENT_CLINIC_OR_DEPARTMENT_OTHER): Payer: Self-pay

## 2023-04-12 ENCOUNTER — Other Ambulatory Visit (HOSPITAL_BASED_OUTPATIENT_CLINIC_OR_DEPARTMENT_OTHER): Payer: Self-pay

## 2023-04-13 ENCOUNTER — Other Ambulatory Visit (HOSPITAL_BASED_OUTPATIENT_CLINIC_OR_DEPARTMENT_OTHER): Payer: Self-pay

## 2023-05-13 ENCOUNTER — Other Ambulatory Visit (HOSPITAL_BASED_OUTPATIENT_CLINIC_OR_DEPARTMENT_OTHER): Payer: Self-pay

## 2023-05-13 ENCOUNTER — Other Ambulatory Visit: Payer: Self-pay | Admitting: Family Medicine

## 2023-05-13 MED ORDER — AMPHETAMINE-DEXTROAMPHET ER 20 MG PO CP24
20.0000 mg | ORAL_CAPSULE | ORAL | 0 refills | Status: DC
  Filled 2023-05-13: qty 30, 30d supply, fill #0

## 2023-06-13 ENCOUNTER — Other Ambulatory Visit: Payer: Self-pay | Admitting: Family Medicine

## 2023-06-14 ENCOUNTER — Other Ambulatory Visit (HOSPITAL_BASED_OUTPATIENT_CLINIC_OR_DEPARTMENT_OTHER): Payer: Self-pay

## 2023-06-14 MED ORDER — AMPHETAMINE-DEXTROAMPHET ER 20 MG PO CP24
20.0000 mg | ORAL_CAPSULE | ORAL | 0 refills | Status: DC
  Filled 2023-06-14: qty 30, 30d supply, fill #0

## 2023-06-14 NOTE — Telephone Encounter (Signed)
Requesting: adderall xr 20mg   Contract: 02/01/23 UDS: 02/01/23 Last Visit: 02/28/23 Next Visit: None Last Refill: 05/13/23 #30 and 0RF  Please Advise

## 2023-07-14 ENCOUNTER — Other Ambulatory Visit (HOSPITAL_BASED_OUTPATIENT_CLINIC_OR_DEPARTMENT_OTHER): Payer: Self-pay

## 2023-07-14 ENCOUNTER — Other Ambulatory Visit: Payer: Self-pay | Admitting: Family Medicine

## 2023-07-14 MED ORDER — AMPHETAMINE-DEXTROAMPHET ER 20 MG PO CP24
20.0000 mg | ORAL_CAPSULE | ORAL | 0 refills | Status: DC
  Filled 2023-07-14: qty 30, 30d supply, fill #0

## 2023-07-14 NOTE — Telephone Encounter (Signed)
Requesting: Adderall XR 20mg   Contract:02/01/23 UDS: 02/01/23 Last Visit: 02/28/23 Next Visit: None Last Refill: 06/14/23 #30 and 0RF   Please Advise

## 2023-08-17 ENCOUNTER — Other Ambulatory Visit: Payer: Self-pay | Admitting: Family Medicine

## 2023-08-18 ENCOUNTER — Other Ambulatory Visit (HOSPITAL_BASED_OUTPATIENT_CLINIC_OR_DEPARTMENT_OTHER): Payer: Self-pay

## 2023-08-23 ENCOUNTER — Other Ambulatory Visit (HOSPITAL_BASED_OUTPATIENT_CLINIC_OR_DEPARTMENT_OTHER): Payer: Self-pay

## 2023-08-23 ENCOUNTER — Other Ambulatory Visit: Payer: Self-pay | Admitting: Family Medicine

## 2023-08-23 MED ORDER — AMPHETAMINE-DEXTROAMPHET ER 20 MG PO CP24
20.0000 mg | ORAL_CAPSULE | ORAL | 0 refills | Status: DC
  Filled 2023-08-23: qty 30, 30d supply, fill #0

## 2023-08-23 NOTE — Telephone Encounter (Signed)
Requesting: Adderall XR 20mg  Contract: 02/01/2023 UDS: 02/01/2023 Last Visit: 02/28/2023 Next Visit: N/A Last Refill: 07/14/2023  Please Advise

## 2023-08-24 ENCOUNTER — Other Ambulatory Visit: Payer: Self-pay

## 2023-08-25 ENCOUNTER — Other Ambulatory Visit (HOSPITAL_BASED_OUTPATIENT_CLINIC_OR_DEPARTMENT_OTHER): Payer: Self-pay

## 2023-08-26 ENCOUNTER — Other Ambulatory Visit (HOSPITAL_BASED_OUTPATIENT_CLINIC_OR_DEPARTMENT_OTHER): Payer: Self-pay

## 2023-08-26 ENCOUNTER — Other Ambulatory Visit: Payer: Self-pay | Admitting: Family Medicine

## 2023-08-26 DIAGNOSIS — F988 Other specified behavioral and emotional disorders with onset usually occurring in childhood and adolescence: Secondary | ICD-10-CM

## 2023-08-26 MED ORDER — AMPHETAMINE-DEXTROAMPHET ER 25 MG PO CP24
25.0000 mg | ORAL_CAPSULE | ORAL | 0 refills | Status: DC
  Filled 2023-08-26: qty 30, 30d supply, fill #0

## 2023-08-30 ENCOUNTER — Other Ambulatory Visit: Payer: Self-pay

## 2023-09-29 ENCOUNTER — Other Ambulatory Visit: Payer: Self-pay | Admitting: Family Medicine

## 2023-09-29 DIAGNOSIS — F988 Other specified behavioral and emotional disorders with onset usually occurring in childhood and adolescence: Secondary | ICD-10-CM

## 2023-09-29 NOTE — Telephone Encounter (Signed)
Requesting: Adderall XR 25mg   Contract: 02/24/23 UDS: 02/01/23 Last Visit: 02/28/23 Next Visit: None Last Refill: 08/26/23 #30 and 0RF   Please Advise

## 2023-09-30 ENCOUNTER — Other Ambulatory Visit: Payer: Self-pay | Admitting: Family Medicine

## 2023-09-30 ENCOUNTER — Other Ambulatory Visit (HOSPITAL_BASED_OUTPATIENT_CLINIC_OR_DEPARTMENT_OTHER): Payer: Self-pay

## 2023-09-30 DIAGNOSIS — F988 Other specified behavioral and emotional disorders with onset usually occurring in childhood and adolescence: Secondary | ICD-10-CM

## 2023-09-30 MED ORDER — AMPHETAMINE-DEXTROAMPHET ER 25 MG PO CP24
25.0000 mg | ORAL_CAPSULE | ORAL | 0 refills | Status: DC
  Filled 2023-09-30: qty 30, 30d supply, fill #0

## 2023-10-05 ENCOUNTER — Other Ambulatory Visit (HOSPITAL_BASED_OUTPATIENT_CLINIC_OR_DEPARTMENT_OTHER): Payer: Self-pay

## 2023-11-01 ENCOUNTER — Ambulatory Visit: Payer: BC Managed Care – PPO | Admitting: Physician Assistant

## 2023-11-01 ENCOUNTER — Encounter: Payer: Self-pay | Admitting: Physician Assistant

## 2023-11-01 ENCOUNTER — Other Ambulatory Visit (HOSPITAL_BASED_OUTPATIENT_CLINIC_OR_DEPARTMENT_OTHER): Payer: Self-pay

## 2023-11-01 VITALS — BP 120/80 | HR 102 | Temp 98.9°F | Resp 18 | Ht 63.0 in | Wt 138.0 lb

## 2023-11-01 DIAGNOSIS — J0110 Acute frontal sinusitis, unspecified: Secondary | ICD-10-CM

## 2023-11-01 MED ORDER — DOXYCYCLINE HYCLATE 100 MG PO TABS
100.0000 mg | ORAL_TABLET | Freq: Two times a day (BID) | ORAL | 0 refills | Status: AC
  Filled 2023-11-01: qty 14, 7d supply, fill #0

## 2023-11-01 MED ORDER — AZELASTINE HCL 0.1 % NA SOLN
1.0000 | Freq: Two times a day (BID) | NASAL | 0 refills | Status: AC
  Filled 2023-11-01: qty 30, 30d supply, fill #0

## 2023-11-01 NOTE — Progress Notes (Signed)
Established patient visit   Patient: Nicole Drake   DOB: [birth year]   47 y.o. Female  MRN: [id number] Visit Date: 11/01/2023  Today's healthcare provider: Alfredia Ferguson, PA-C   Chief Complaint  Patient presents with   Sinus Problem    X1 week, pt states having SOB and states facial pressure, and sore throat but no pain with swallowing. No cough Four negative COVID test   Subjective    Pt reports fatigue, overall malaise for the last week, last few days worsening headache, sinus pressure. She has been experiencing some shortness of breath going up stairs. Medications: Outpatient Medications Prior to Visit  Medication Sig   amphetamine-dextroamphetamine (ADDERALL XR) 25 MG 24 hr capsule Take 1 capsule by mouth every morning.   cyclobenzaprine (FLEXERIL) 10 MG tablet Take 1 tablet (10 mg total) by mouth 3 (three) times daily as needed for muscle spasms.   famotidine (PEPCID) 20 MG tablet Take 1 tablet (20 mg total) by mouth 2 (two) times daily.   HYDROcodone-acetaminophen (NORCO/VICODIN) 5-325 MG tablet Take 1 tablet by mouth every 6 (six) hours as needed for moderate pain.   [DISCONTINUED] predniSONE (DELTASONE) 10 MG tablet Take 3 tablets by mouth daily for 3 days, then 2 tablets daily for 3 days, then 1 tablet daily for 3 days, and then 1/2 tablet daily for 3 days.   No facility-administered medications prior to visit.    Review of Systems     Objective    BP 120/80 (BP Location: Right Arm, Patient Position: Sitting, Cuff Size: Normal)   Pulse (!) 102   Temp 98.9 F (37.2 C) (Oral)   Resp 18   Ht 5\' 3"  (1.6 m)   Wt 138 lb (62.6 kg)   SpO2 99%   BMI 24.45 kg/m    Physical Exam Constitutional:      General: She is awake.     Appearance: She is well-developed.  HENT:     Head: Normocephalic.     Mouth/Throat:     Pharynx: Posterior oropharyngeal erythema present. No oropharyngeal exudate.  Eyes:     Conjunctiva/sclera: Conjunctivae normal.   Cardiovascular:     Rate and Rhythm: Normal rate and regular rhythm.     Heart sounds: Normal heart sounds.  Pulmonary:     Effort: Pulmonary effort is normal.     Breath sounds: Normal breath sounds. No wheezing, rhonchi or rales.  Skin:    General: Skin is warm.  Neurological:     Mental Status: She is alert and oriented to person, place, and time.  Psychiatric:        Attention and Perception: Attention normal.        Mood and Affect: Mood normal.        Speech: Speech normal.        Behavior: Behavior is cooperative.      No results found for any visits on 11/01/23.  Assessment & Plan    Acute non-recurrent frontal sinusitis -     Doxycycline Hyclate; Take 1 tablet (100 mg total) by mouth 2 (two) times daily for 7 days.  Dispense: 14 tablet; Refill: 0 -     Azelastine HCl; Place 1 spray into both nostrils 2 (two) times daily. Use in each nostril as directed  Dispense: 30 mL; Refill: 0   Advised hydrate, rest, tylenol/ibuprofen, saline nasal rinses rx doxycycline bid x 7 days, azelastine nasal spray  Return if symptoms worsen or fail to improve.  Alfredia Ferguson, PA-C  [facility] at [facility] [phone] (phone) [phone] (fax)  [facility]

## 2023-11-07 ENCOUNTER — Other Ambulatory Visit: Payer: Self-pay | Admitting: Family Medicine

## 2023-11-07 DIAGNOSIS — F988 Other specified behavioral and emotional disorders with onset usually occurring in childhood and adolescence: Secondary | ICD-10-CM

## 2023-11-08 ENCOUNTER — Other Ambulatory Visit (HOSPITAL_BASED_OUTPATIENT_CLINIC_OR_DEPARTMENT_OTHER): Payer: Self-pay

## 2023-11-08 MED ORDER — AMPHETAMINE-DEXTROAMPHET ER 25 MG PO CP24
25.0000 mg | ORAL_CAPSULE | ORAL | 0 refills | Status: DC
  Filled 2023-11-08: qty 30, 30d supply, fill #0

## 2023-11-08 NOTE — Telephone Encounter (Signed)
Requesting: Adderall XR 25mg   Contract:02/01/23 UDS:02/01/23 Last Visit: 02/28/23 Next Visit: None Last Refill: 09/30/23 #30 and 0RF   Please Advise

## 2023-12-30 ENCOUNTER — Other Ambulatory Visit: Payer: Self-pay | Admitting: Family Medicine

## 2023-12-30 ENCOUNTER — Other Ambulatory Visit (HOSPITAL_BASED_OUTPATIENT_CLINIC_OR_DEPARTMENT_OTHER): Payer: Self-pay

## 2023-12-30 DIAGNOSIS — F988 Other specified behavioral and emotional disorders with onset usually occurring in childhood and adolescence: Secondary | ICD-10-CM

## 2023-12-30 MED ORDER — AMPHETAMINE-DEXTROAMPHET ER 25 MG PO CP24
25.0000 mg | ORAL_CAPSULE | ORAL | 0 refills | Status: DC
  Filled 2023-12-30: qty 30, 30d supply, fill #0

## 2023-12-30 NOTE — Telephone Encounter (Signed)
Requesting: Adderall XR 25mg   Contract: 02/24/23 UDS: 02/01/23 Last Visit: 02/28/23 Next Visit: None Last Refill: 11/08/23 #30 and 0RF   Please Advise

## 2024-01-02 ENCOUNTER — Other Ambulatory Visit (HOSPITAL_BASED_OUTPATIENT_CLINIC_OR_DEPARTMENT_OTHER): Payer: Self-pay

## 2024-01-16 ENCOUNTER — Ambulatory Visit: Payer: BC Managed Care – PPO | Admitting: Family Medicine

## 2024-01-16 ENCOUNTER — Other Ambulatory Visit (HOSPITAL_BASED_OUTPATIENT_CLINIC_OR_DEPARTMENT_OTHER): Payer: Self-pay

## 2024-01-16 ENCOUNTER — Ambulatory Visit: Payer: Self-pay | Admitting: Family Medicine

## 2024-01-16 VITALS — BP 118/76 | HR 89 | Temp 98.4°F | Resp 18

## 2024-01-16 DIAGNOSIS — J029 Acute pharyngitis, unspecified: Secondary | ICD-10-CM | POA: Diagnosis not present

## 2024-01-16 DIAGNOSIS — R051 Acute cough: Secondary | ICD-10-CM

## 2024-01-16 LAB — POCT INFLUENZA A/B
Influenza A, POC: NEGATIVE
Influenza B, POC: NEGATIVE

## 2024-01-16 LAB — POCT RAPID STREP A (OFFICE): Rapid Strep A Screen: NEGATIVE

## 2024-01-16 MED ORDER — HYDROCODONE BIT-HOMATROP MBR 5-1.5 MG/5ML PO SOLN
5.0000 mL | Freq: Three times a day (TID) | ORAL | 0 refills | Status: AC | PRN
  Filled 2024-01-16: qty 60, 4d supply, fill #0

## 2024-01-16 MED ORDER — PREDNISONE 20 MG PO TABS
20.0000 mg | ORAL_TABLET | Freq: Every day | ORAL | 0 refills | Status: DC
  Filled 2024-01-16: qty 5, 5d supply, fill #0

## 2024-01-16 NOTE — Telephone Encounter (Signed)
Scheduled to see Dr. Patsy Lager today

## 2024-01-16 NOTE — Telephone Encounter (Signed)
Pt was scheduled as a VV for Sore throat sxs. When I called the pt back to try see how long she had her sxs, to see if she needed to come in office per Dr. Cyndie Chime request pt stated "she had been on hold for 25 minutes trying to speak to someone and did not feel like repeating herself, it had been a long weekend and she does not care if it needs to be in person or virtual."  I then stated that I would see her in office at 11:40 and ended the call.

## 2024-01-16 NOTE — Telephone Encounter (Signed)
Copied from CRM [id number]. Topic: Clinical - Red Word Triage >> Jan 16, 2024  8:39 AM Louie Boston wrote: Red Word that prompted transfer to Nurse Triage: Shortness of Breath   Chief Complaint: Sore Throat Symptoms: Cough, Congestion Frequency: Since Friday Pertinent Negatives: Patient denies s Disposition: [] ED /[] Urgent Care (no appt availability in office) / [] Appointment(In office/virtual)/ []  [facility]/ [] Home Care/ [] Refused Recommended Disposition /[] [place] [facility]/ []  Follow-up with PCP Additional Notes: BO is a 48 year old being triaged for a sore throat and cold symptoms. The patient states the symptoms began Friday, and is wanting a visit today before symptoms worsened. The patient states she has chest soreness from coughing and mild shortness of breath along with a runny nose. The patient states having taken prednisone and reported an improvement in symptoms. Appointment made today for evaluation of symptoms.    Reason for Disposition  [1] Sore throat with cough/cold symptoms AND [2] present > 5 days  Answer Assessment - Initial Assessment Questions 1. ONSET: "When did the throat start hurting?" (Hours or days ago)      Since Friday  2. SEVERITY: "How bad is the sore throat?" (Scale 1-10; mild, moderate or severe)   - MILD (1-3):  Doesn't interfere with eating or normal activities.   - MODERATE (4-7): Interferes with eating some solids and normal activities.   - SEVERE (8-10):  Excruciating pain, interferes with most normal activities.   - SEVERE WITH DYSPHAGIA (10): Can't swallow liquids, drooling.     Moderate  3. STREP EXPOSURE: "Has there been any exposure to strep within the past week?" If Yes, ask: "What type of contact occurred?"      Yes, exposed to granddaughter in [place], Consistently for Hours, similar symptoms, not Strep specifically.  4.  VIRAL SYMPTOMS: "Are there any symptoms of a cold, such as a runny nose, cough, hoarse voice or red eyes?"       Runny Nose, Cough  5. FEVER: "Do you have a fever?" If Yes, ask: "What is your temperature, how was it measured, and when did it start?"     No  6. PUS ON THE TONSILS: "Is there pus on the tonsils in the back of your throat?"     Unsure  7. OTHER SYMPTOMS: "Do you have any other symptoms?" (e.g., difficulty breathing, headache, rash)     Shortness of breath  8. PREGNANCY: "Is there any chance you are pregnant?" "When was your last menstrual period?"     IUD, irregular  Protocols used: Sore Throat-A-AH

## 2024-01-16 NOTE — Patient Instructions (Signed)
Good to see you today- I hope you are feeling better soon!  Prednisone for 3-5 days Cough syrup as needed- will cause drowsiness Please let me know if you are not feeling better in the next few days- Sooner if worse.

## 2024-01-16 NOTE — Progress Notes (Signed)
[facility] [address] [place], [place] [address] [phone]  Date:  01/16/2024   Name:  Nicole Drake   DOB:  10-28-1976   MRN:  [id number]  PCP:  Donato Schultz, DO    Chief Complaint: URI   History of Present Illness:  Nicole Drake is a 48 y.o. very pleasant female patient who presents with the following:  Pt seen today with concern of ST Generally in good health, pt of Dr Zola Button  Pt notes she went to visit her grands last week- one of them was sick with a cold- did not seem like anything major, no particular diagnosis given to the child Today is Monday- on Friday she noted onset of a ST, cough which is worse with deep breath She had 1 prednisone tablet on hand and took it yesterday, she feels like it helped her She also notes sneezing, headache and congestion Some body aches She tested for covid and was negative twice-most recently yesterday She feels tired No GI symptoms  She has not noted a fever She is using nyquil to help her sleep  Patient Active Problem List   Diagnosis Date Noted   Dyspepsia 02/02/2023   Seasonal allergies 03/28/2019   ADD (attention deficit disorder) 09/16/2015    No past medical history on file.  Past Surgical History:  Procedure Laterality Date   AUGMENTATION MAMMAPLASTY Bilateral    BREAST BIOPSY Left 09/15/2022   CERVIX SURGERY     partial remove/age 17   OVARIAN CYST REMOVAL     age 45, 75, 76   TONSILLECTOMY     age 74    Social History   Tobacco Use   Smoking status: Former    Current packs/day: 0.00    Average packs/day: 0.3 packs/day for 18.0 years (4.5 ttl pk-yrs)    Types: Cigarettes    Start date: 08/23/1998    Quit date: 08/23/2016    Years since quitting: 7.4   Smokeless tobacco: Never  Vaping Use   Vaping status: Former  Substance Use Topics   Alcohol use: Yes    Comment: once  a month   Drug use: No    Family History   Problem Relation Age of Onset   Heart disease Father 1       MI   Ovarian cancer Maternal Grandmother    Diabetes Maternal Grandmother    Heart disease Maternal Grandfather 36       MI   Heart disease Paternal Grandfather 86       MI    Allergies  Allergen Reactions   Penicillins Rash    Medication list has been reviewed and updated.  Current Outpatient Medications on File Prior to Visit  Medication Sig Dispense Refill   amphetamine-dextroamphetamine (ADDERALL XR) 25 MG 24 hr capsule Take 1 capsule by mouth every morning. 30 capsule 0   azelastine (ASTELIN) 0.1 % nasal spray Place 1 spray into both nostrils 2 (two) times daily. 30 mL 0   cyclobenzaprine (FLEXERIL) 10 MG tablet Take 1 tablet (10 mg total) by mouth 3 (three) times daily as needed for muscle spasms. 30 tablet 0   famotidine (PEPCID) 20 MG tablet Take 1 tablet (20 mg total) by mouth 2 (two) times daily. 60 tablet 2   HYDROcodone-acetaminophen (NORCO/VICODIN) 5-325 MG tablet Take 1 tablet by mouth every 6 (six) hours as needed for moderate pain. 20 tablet 0  No current facility-administered medications on file prior to visit.    Review of Systems:  As per HPI- otherwise negative.   Physical Examination: Vitals:   01/16/24 1201  BP: 118/76  Pulse: 89  Resp: 18  Temp: 98.4 F (36.9 C)  SpO2: 98%   There were no vitals filed for this visit. There is no height or weight on file to calculate BMI. Ideal Body Weight:    GEN: no acute distress.  Normal weight, looks well HEENT: Atraumatic, Normocephalic.  Bilateral TM wnl, oropharynx normal.  PEERL,EOMI.   Ears and Nose: No external deformity. CV: RRR, No M/G/R. No JVD. No thrill. No extra heart sounds. PULM: CTA B, no wheezes, crackles, rhonchi. No retractions. No resp. distress. No accessory muscle use. ABD: S, NT, ND, +BS. No rebound. No HSM. EXTR: No c/c/e PSYCH: Normally interactive. Conversant.   Results for orders placed or performed in visit on  01/16/24  POCT rapid strep A   Collection Time: 01/16/24 12:00 PM  Result Value Ref Range   Rapid Strep A Screen Negative Negative  POCT Influenza A/B   Collection Time: 01/16/24 12:08 PM  Result Value Ref Range   Influenza A, POC Negative Negative   Influenza B, POC Negative Negative    Assessment and Plan: Sore throat - Plan: POCT Influenza A/B, POCT rapid strep A  Acute cough - Plan: POCT Influenza A/B, POCT rapid strep A, HYDROcodone bit-homatropine (HYCODAN) 5-1.5 MG/5ML syrup, predniSONE (DELTASONE) 20 MG tablet  Patient seen today with concern of illness.  She has noted sore throat, cough, sneezing, sinus congestion for about 3 days.  She tested for COVID at home was negative, flu and strep negative today Likely viral illness Patient notes 1 dose of prednisone really helped her, I gave her 20 mg to take for 3 to 5 days as desired Hycodan syrup to use as needed, caution regarding sedation I asked her to let me know if not feeling better in the next few days, sooner if worse  Signed Abbe Amsterdam, MD

## 2024-02-20 ENCOUNTER — Other Ambulatory Visit (HOSPITAL_BASED_OUTPATIENT_CLINIC_OR_DEPARTMENT_OTHER): Payer: Self-pay

## 2024-02-20 ENCOUNTER — Other Ambulatory Visit: Payer: Self-pay | Admitting: Family Medicine

## 2024-02-20 ENCOUNTER — Encounter: Payer: Self-pay | Admitting: Family Medicine

## 2024-02-20 ENCOUNTER — Ambulatory Visit (INDEPENDENT_AMBULATORY_CARE_PROVIDER_SITE_OTHER): Admitting: Family Medicine

## 2024-02-20 VITALS — BP 110/70 | HR 87 | Temp 98.8°F | Resp 16 | Ht 63.0 in | Wt 200.8 lb

## 2024-02-20 DIAGNOSIS — Z0001 Encounter for general adult medical examination with abnormal findings: Secondary | ICD-10-CM

## 2024-02-20 DIAGNOSIS — H1130 Conjunctival hemorrhage, unspecified eye: Secondary | ICD-10-CM

## 2024-02-20 DIAGNOSIS — Z1211 Encounter for screening for malignant neoplasm of colon: Secondary | ICD-10-CM

## 2024-02-20 DIAGNOSIS — Z79899 Other long term (current) drug therapy: Secondary | ICD-10-CM | POA: Diagnosis not present

## 2024-02-20 DIAGNOSIS — F988 Other specified behavioral and emotional disorders with onset usually occurring in childhood and adolescence: Secondary | ICD-10-CM

## 2024-02-20 DIAGNOSIS — Z13220 Encounter for screening for lipoid disorders: Secondary | ICD-10-CM | POA: Diagnosis not present

## 2024-02-20 DIAGNOSIS — Z0000 Encounter for general adult medical examination without abnormal findings: Secondary | ICD-10-CM

## 2024-02-20 DIAGNOSIS — Z8249 Family history of ischemic heart disease and other diseases of the circulatory system: Secondary | ICD-10-CM

## 2024-02-20 MED ORDER — AMPHETAMINE-DEXTROAMPHET ER 25 MG PO CP24
25.0000 mg | ORAL_CAPSULE | ORAL | 0 refills | Status: DC
  Filled 2024-02-20: qty 30, 30d supply, fill #0

## 2024-02-20 NOTE — Telephone Encounter (Signed)
 Requesting: Adderal  Contract: 01/2023 UDS: 01/2023 Last OV: 11/01/23 Next OV: 02/20/24 Last Refill: 12/29/2026, #30--0 RF Database:   Please advise

## 2024-02-20 NOTE — Progress Notes (Signed)
 Established Patient Office Visit  Subjective   Patient ID: Nicole Drake, female    DOB: [birth year]  Age: 47 y.o. MRN: [id number]  Chief Complaint  Patient presents with   Annual Exam    HPI Discussed the use of AI scribe software for clinical note transcription with the patient, who gave verbal consent to proceed.  History of Present Illness   The patient presents for a routine follow-up and requests a referral for a colonoscopy.  She has identified Dr. Loreta Ave as the preferred doctor for the procedure, based on a recommendation from someone she knows.  She reports an eye issue that began on Tuesday after waking up, describing the eye as initially completely red on one side, which has since improved. She suspects she may have scratched it while wearing contacts, as it felt like there was 'a little bit of an eyelash in it.' She has not been using any eye drops. She is not currently wearing her contacts and notes that the redness has decreased significantly.  She mentions experiencing symptoms related to pre-menopause, including fluctuating body temperature, feeling hot frequently, and difficulty with temperature regulation at home, especially in contrast to her husband who feels cold. She describes needing blankets for comfort despite feeling hot.  She reports walking seven and a half miles recently, although she notes that it is becoming more challenging compared to the past. She mentions some joint stiffness and reduced flexibility, which she attributes to aging.      Patient Active Problem List   Diagnosis Date Noted   Dyspepsia 02/02/2023   Seasonal allergies 03/28/2019   ADD (attention deficit disorder) 09/16/2015   No past medical history on file. Past Surgical History:  Procedure Laterality Date   AUGMENTATION MAMMAPLASTY Bilateral    BREAST BIOPSY Left 09/15/2022   CERVIX SURGERY     partial remove/age 78   OVARIAN CYST REMOVAL     age 74, 13, 71   TONSILLECTOMY      age 3   Social History   Tobacco Use   Smoking status: Former    Current packs/day: 0.00    Average packs/day: 0.3 packs/day for 18.0 years (4.5 ttl pk-yrs)    Types: Cigarettes    Start date: 08/23/1998    Quit date: 08/23/2016    Years since quitting: 7.4   Smokeless tobacco: Never  Vaping Use   Vaping status: Former  Substance Use Topics   Alcohol use: Yes    Comment: once  a month   Drug use: No   Social History   Socioeconomic History   Marital status: Divorced    Spouse name: Not on file   Number of children: 2   Years of education: Not on file   Highest education level: Not on file  Occupational History   Occupation: Education officer, museum  Tobacco Use   Smoking status: Former    Current packs/day: 0.00    Average packs/day: 0.3 packs/day for 18.0 years (4.5 ttl pk-yrs)    Types: Cigarettes    Start date: 08/23/1998    Quit date: 08/23/2016    Years since quitting: 7.4   Smokeless tobacco: Never  Vaping Use   Vaping status: Former  Substance and Sexual Activity   Alcohol use: Yes    Comment: once  a month   Drug use: No   Sexual activity: Yes  Other Topics Concern   Not on file  Social History Narrative   Not on file   Social Drivers  of Health   Financial Resource Strain: Not on file  Food Insecurity: Not on file  Transportation Needs: Not on file  Physical Activity: Not on file  Stress: Not on file  Social Connections: Unknown (04/17/2022)   Received from [facility]   Social Network    Social Network: Not on file  Intimate Partner Violence: Unknown (03/15/2022)   Received from [facility], [facility]   HITS    Physically Hurt: Not on file    Insult or Talk Down To: Not on file    Threaten Physical Harm: Not on file    Scream or Curse: Not on file   Family Status  Relation Name Status   Mother  Alive   Father  Deceased   MGM  (Not Specified)   MGF  Deceased   PGF  Deceased  No partnership data on file   Family History   Problem Relation Age of Onset   Heart disease Father 71       MI   Ovarian cancer Maternal Grandmother    Diabetes Maternal Grandmother    Heart disease Maternal Grandfather 46       MI   Heart disease Paternal Grandfather 56       MI   Allergies  Allergen Reactions   Penicillins Rash      Review of Systems  Constitutional:  Negative for chills, fever and malaise/fatigue.  HENT:  Negative for congestion and hearing loss.   Eyes:  Negative for blurred vision and discharge.  Respiratory:  Negative for cough, sputum production and shortness of breath.   Cardiovascular:  Negative for chest pain, palpitations and leg swelling.  Gastrointestinal:  Negative for abdominal pain, blood in stool, constipation, diarrhea, heartburn, nausea and vomiting.  Genitourinary:  Negative for dysuria, frequency, hematuria and urgency.  Musculoskeletal:  Negative for back pain, falls and myalgias.  Skin:  Negative for rash.  Neurological:  Negative for dizziness, sensory change, loss of consciousness, weakness and headaches.  Endo/Heme/Allergies:  Negative for environmental allergies. Does not bruise/bleed easily.  Psychiatric/Behavioral:  Negative for depression and suicidal ideas. The patient is not nervous/anxious and does not have insomnia.       Objective:     BP 110/70 (BP Location: Left Arm, Patient Position: Sitting, Cuff Size: Normal)   Pulse 87   Temp 98.8 F (37.1 C) (Oral)   Resp 16   Ht 5\' 3"  (1.6 m)   Wt 200 lb 12.8 oz (91.1 kg)   SpO2 98%   BMI 35.57 kg/m  BP Readings from Last 3 Encounters:  02/20/24 110/70  01/16/24 118/76  11/01/23 120/80   Wt Readings from Last 3 Encounters:  02/20/24 200 lb 12.8 oz (91.1 kg)  11/01/23 138 lb (62.6 kg)  02/28/23 119 lb 6.4 oz (54.2 kg)   SpO2 Readings from Last 3 Encounters:  02/20/24 98%  01/16/24 98%  11/01/23 99%      Physical Exam Vitals and nursing note reviewed.  Constitutional:      General: She is not in acute  distress.    Appearance: Normal appearance. She is well-developed.  HENT:     Head: Normocephalic and atraumatic.     Right Ear: Tympanic membrane, ear canal and external ear normal. There is no impacted cerumen.     Left Ear: Tympanic membrane, ear canal and external ear normal. There is no impacted cerumen.     Nose: Nose normal.     Mouth/Throat:  Mouth: Mucous membranes are moist.     Pharynx: Oropharynx is clear. No oropharyngeal exudate or posterior oropharyngeal erythema.  Eyes:     General: No scleral icterus.       Right eye: No discharge.        Left eye: No discharge.     Conjunctiva/sclera: Conjunctivae normal.     Pupils: Pupils are equal, round, and reactive to light.  Neck:     Thyroid: No thyromegaly or thyroid tenderness.     Vascular: No JVD.  Cardiovascular:     Rate and Rhythm: Normal rate and regular rhythm.     Heart sounds: Normal heart sounds. No murmur heard. Pulmonary:     Effort: Pulmonary effort is normal. No respiratory distress.     Breath sounds: Normal breath sounds.  Abdominal:     General: Bowel sounds are normal. There is no distension.     Palpations: Abdomen is soft. There is no mass.     Tenderness: There is no abdominal tenderness. There is no guarding or rebound.  Genitourinary:    Vagina: Normal.  Musculoskeletal:        General: Normal range of motion.     Cervical back: Normal range of motion and neck supple.     Right lower leg: No edema.     Left lower leg: No edema.  Lymphadenopathy:     Cervical: No cervical adenopathy.  Skin:    General: Skin is warm and dry.     Findings: No erythema or rash.  Neurological:     Mental Status: She is alert and oriented to person, place, and time.     Cranial Nerves: No cranial nerve deficit.     Deep Tendon Reflexes: Reflexes are normal and symmetric.  Psychiatric:        Mood and Affect: Mood normal.        Behavior: Behavior normal.        Thought Content: Thought content normal.         Judgment: Judgment normal.      No results found for any visits on 02/20/24.  Last CBC Lab Results  Component Value Date   WBC 7.8 06/07/2022   HGB 12.9 06/07/2022   HCT 38.5 06/07/2022   MCV 97.3 06/07/2022   MCH 33.0 02/20/2021   RDW 13.6 06/07/2022   PLT 295.0 06/07/2022   Last metabolic panel Lab Results  Component Value Date   GLUCOSE 87 06/07/2022   NA 137 06/07/2022   K 4.2 06/07/2022   CL 105 06/07/2022   CO2 23 06/07/2022   BUN 12 06/07/2022   CREATININE 0.77 06/07/2022   GFR 91.98 06/07/2022   CALCIUM 9.1 06/07/2022   PROT 6.7 06/07/2022   ALBUMIN 4.2 06/07/2022   BILITOT 0.4 06/07/2022   ALKPHOS 33 (L) 06/07/2022   AST 19 06/07/2022   ALT 13 06/07/2022   Last lipids Lab Results  Component Value Date   CHOL 134 11/29/2013   HDL 57.30 11/29/2013   LDLCALC 66 11/29/2013   TRIG 53.0 11/29/2013   CHOLHDL 2 11/29/2013   Last hemoglobin A1c Lab Results  Component Value Date   HGBA1C 4.6 02/20/2021   Last thyroid functions Lab Results  Component Value Date   TSH 2.22 09/14/2022   Last vitamin D No results found for: "25OHVITD2", "25OHVITD3", "VD25OH" Last vitamin B12 and Folate Lab Results  Component Value Date   VITAMINB12 369 02/20/2021      The ASCVD Risk score (Arnett DK,  et al., 2019) failed to calculate for the following reasons:   Cannot find a previous HDL lab   Cannot find a previous total cholesterol lab    Assessment & Plan:   Problem List Items Addressed This Visit       Unprioritized   ADD (attention deficit disorder)   Other Visit Diagnoses       Preventative health care    -  Primary   Relevant Orders   CBC with Differential/Platelet   Comprehensive metabolic panel   Lipid panel   TSH     Colon cancer screening       Relevant Orders   Ambulatory referral to Gastroenterology     Assessment and Plan    Subconjunctival Hemorrhage   She presented with a red eye after waking up and has not worn contacts  since the incident. The redness has decreased but persists, possibly due to a scratch or irritation. Use lubricating eye drops like Systane and avoid wearing contacts until the eye heals. Follow up with an eye doctor if not resolved by the end of the week.  ADHD   Ongoing management is required. She recently refilled her Adderall prescription and does not need additional refills at this time. Continue the current Adderall prescription.  Heartburn   Her heartburn is well-managed with current medication, with no new symptoms or concerns. Continue the current heartburn medication.  General Health Maintenance   She is up to date with eye and dental visits. She inquired about the shingles vaccine and was informed that insurance covers it at age 7. A colonoscopy referral was discussed, and she prefers Dr. Charna Elizabeth. Refer to Dr. Charna Elizabeth for a colonoscopy and schedule the shingles vaccine after turning 49. Offer a tetanus vaccine if needed after an injury.  Follow-up   Perform blood work today.        No follow-ups on file.    Donato Schultz, DO

## 2024-02-21 ENCOUNTER — Encounter: Payer: Self-pay | Admitting: Family Medicine

## 2024-02-21 ENCOUNTER — Other Ambulatory Visit: Payer: Self-pay

## 2024-02-21 DIAGNOSIS — F988 Other specified behavioral and emotional disorders with onset usually occurring in childhood and adolescence: Secondary | ICD-10-CM | POA: Diagnosis not present

## 2024-02-21 DIAGNOSIS — Z79899 Other long term (current) drug therapy: Secondary | ICD-10-CM | POA: Diagnosis not present

## 2024-02-21 LAB — CBC WITH DIFFERENTIAL/PLATELET
Basophils Absolute: 0 10*3/uL (ref 0.0–0.1)
Basophils Relative: 0.5 % (ref 0.0–3.0)
Eosinophils Absolute: 0.1 10*3/uL (ref 0.0–0.7)
Eosinophils Relative: 2.1 % (ref 0.0–5.0)
HCT: 38.6 % (ref 36.0–46.0)
Hemoglobin: 12.8 g/dL (ref 12.0–15.0)
Lymphocytes Relative: 39.4 % (ref 12.0–46.0)
Lymphs Abs: 2.5 10*3/uL (ref 0.7–4.0)
MCHC: 33.3 g/dL (ref 30.0–36.0)
MCV: 97.8 fl (ref 78.0–100.0)
Monocytes Absolute: 0.6 10*3/uL (ref 0.1–1.0)
Monocytes Relative: 8.8 % (ref 3.0–12.0)
Neutro Abs: 3.1 10*3/uL (ref 1.4–7.7)
Neutrophils Relative %: 49.2 % (ref 43.0–77.0)
Platelets: 325 10*3/uL (ref 150.0–400.0)
RBC: 3.95 Mil/uL (ref 3.87–5.11)
RDW: 13.3 % (ref 11.5–15.5)
WBC: 6.4 10*3/uL (ref 4.0–10.5)

## 2024-02-21 LAB — COMPREHENSIVE METABOLIC PANEL
ALT: 15 U/L (ref 0–35)
AST: 17 U/L (ref 0–37)
Albumin: 4.4 g/dL (ref 3.5–5.2)
Alkaline Phosphatase: 44 U/L (ref 39–117)
BUN: 13 mg/dL (ref 6–23)
CO2: 26 meq/L (ref 19–32)
Calcium: 8.7 mg/dL (ref 8.4–10.5)
Chloride: 106 meq/L (ref 96–112)
Creatinine, Ser: 0.8 mg/dL (ref 0.40–1.20)
GFR: 86.81 mL/min (ref >60.00)
Glucose, Bld: 93 mg/dL (ref 70–99)
Potassium: 3.7 meq/L (ref 3.5–5.1)
Sodium: 140 meq/L (ref 135–145)
Total Bilirubin: 0.3 mg/dL (ref 0.2–1.2)
Total Protein: 6.9 g/dL (ref 6.0–8.3)

## 2024-02-21 LAB — TSH: TSH: 0.91 u[IU]/mL (ref 0.35–5.50)

## 2024-02-21 LAB — LIPID PANEL
Cholesterol: 128 mg/dL (ref 0–200)
HDL: 52.4 mg/dL (ref >39.00)
LDL Cholesterol: 52 mg/dL (ref 0–99)
NonHDL: 75.72
Total CHOL/HDL Ratio: 2
Triglycerides: 117 mg/dL (ref 0.0–149.0)
VLDL: 23.4 mg/dL (ref 0.0–40.0)

## 2024-02-21 NOTE — Addendum Note (Signed)
 Addended by: Mervin Kung A on: 02/21/2024 11:49 AM   Modules accepted: Orders

## 2024-02-23 ENCOUNTER — Encounter: Payer: Self-pay | Admitting: Family Medicine

## 2024-02-23 LAB — DRUG MONITORING PANEL 376104, URINE
Amphetamines: NEGATIVE ng/mL (ref <500)
Barbiturates: NEGATIVE ng/mL (ref <300)
Benzodiazepines: NEGATIVE ng/mL (ref <100)
Cocaine Metabolite: NEGATIVE ng/mL (ref <150)
Desmethyltramadol: NEGATIVE ng/mL (ref <100)
Opiates: NEGATIVE ng/mL (ref <100)
Oxycodone: NEGATIVE ng/mL (ref <100)
Tramadol: NEGATIVE ng/mL (ref <100)

## 2024-02-23 LAB — DM TEMPLATE

## 2024-03-05 ENCOUNTER — Ambulatory Visit (HOSPITAL_BASED_OUTPATIENT_CLINIC_OR_DEPARTMENT_OTHER)
Admission: RE | Admit: 2024-03-05 | Discharge: 2024-03-05 | Disposition: A | Discharge status: Home / Self Care | Payer: Self-pay | Source: Ambulatory Visit | Attending: Family Medicine | Admitting: Family Medicine

## 2024-03-05 DIAGNOSIS — Z8249 Family history of ischemic heart disease and other diseases of the circulatory system: Secondary | ICD-10-CM | POA: Insufficient documentation

## 2024-03-06 DIAGNOSIS — Z1151 Encounter for screening for human papillomavirus (HPV): Secondary | ICD-10-CM | POA: Diagnosis not present

## 2024-03-06 DIAGNOSIS — R87810 Cervical high risk human papillomavirus (HPV) DNA test positive: Secondary | ICD-10-CM | POA: Diagnosis not present

## 2024-03-06 DIAGNOSIS — Z01419 Encounter for gynecological examination (general) (routine) without abnormal findings: Secondary | ICD-10-CM | POA: Diagnosis not present

## 2024-03-06 DIAGNOSIS — Z130 Encounter for screening for diseases of the blood and blood-forming organs and certain disorders involving the immune mechanism: Secondary | ICD-10-CM | POA: Diagnosis not present

## 2024-03-06 DIAGNOSIS — Z124 Encounter for screening for malignant neoplasm of cervix: Secondary | ICD-10-CM | POA: Diagnosis not present

## 2024-03-06 DIAGNOSIS — Z1231 Encounter for screening mammogram for malignant neoplasm of breast: Secondary | ICD-10-CM | POA: Diagnosis not present

## 2024-03-06 LAB — HM PAP SMEAR

## 2024-03-14 ENCOUNTER — Encounter: Payer: Self-pay | Admitting: Family Medicine

## 2024-03-22 DIAGNOSIS — K5904 Chronic idiopathic constipation: Secondary | ICD-10-CM | POA: Diagnosis not present

## 2024-03-22 DIAGNOSIS — Z1211 Encounter for screening for malignant neoplasm of colon: Secondary | ICD-10-CM | POA: Diagnosis not present

## 2024-03-26 DIAGNOSIS — L821 Other seborrheic keratosis: Secondary | ICD-10-CM | POA: Diagnosis not present

## 2024-03-26 DIAGNOSIS — D225 Melanocytic nevi of trunk: Secondary | ICD-10-CM | POA: Diagnosis not present

## 2024-03-26 DIAGNOSIS — B360 Pityriasis versicolor: Secondary | ICD-10-CM | POA: Diagnosis not present

## 2024-03-26 DIAGNOSIS — L814 Other melanin hyperpigmentation: Secondary | ICD-10-CM | POA: Diagnosis not present

## 2024-04-18 ENCOUNTER — Other Ambulatory Visit: Payer: Self-pay | Admitting: Family Medicine

## 2024-04-18 DIAGNOSIS — F988 Other specified behavioral and emotional disorders with onset usually occurring in childhood and adolescence: Secondary | ICD-10-CM

## 2024-04-18 NOTE — Telephone Encounter (Signed)
 Requesting: Adderall XR Contract: 02/21/24 UDS: 02/21/2024 Last OV: 02/20/2024 Next OV: n/a Last Refill: 02/20/2024, #30--0 RF Database:   Please advise

## 2024-04-20 ENCOUNTER — Other Ambulatory Visit (HOSPITAL_BASED_OUTPATIENT_CLINIC_OR_DEPARTMENT_OTHER): Payer: Self-pay

## 2024-04-20 MED ORDER — AMPHETAMINE-DEXTROAMPHET ER 25 MG PO CP24
25.0000 mg | ORAL_CAPSULE | ORAL | 0 refills | Status: DC
  Filled 2024-04-20: qty 30, 30d supply, fill #0

## 2024-04-23 DIAGNOSIS — N879 Dysplasia of cervix uteri, unspecified: Secondary | ICD-10-CM | POA: Diagnosis not present

## 2024-04-30 DIAGNOSIS — Z1211 Encounter for screening for malignant neoplasm of colon: Secondary | ICD-10-CM | POA: Diagnosis not present

## 2024-04-30 LAB — HM COLONOSCOPY

## 2024-06-01 ENCOUNTER — Encounter: Payer: Self-pay | Admitting: Family Medicine

## 2024-06-14 ENCOUNTER — Other Ambulatory Visit: Payer: Self-pay | Admitting: Family Medicine

## 2024-06-14 ENCOUNTER — Other Ambulatory Visit (HOSPITAL_BASED_OUTPATIENT_CLINIC_OR_DEPARTMENT_OTHER): Payer: Self-pay

## 2024-06-14 DIAGNOSIS — F988 Other specified behavioral and emotional disorders with onset usually occurring in childhood and adolescence: Secondary | ICD-10-CM

## 2024-06-14 MED ORDER — AMPHETAMINE-DEXTROAMPHET ER 25 MG PO CP24
25.0000 mg | ORAL_CAPSULE | ORAL | 0 refills | Status: DC
  Filled 2024-06-14: qty 30, 30d supply, fill #0

## 2024-06-14 NOTE — Telephone Encounter (Signed)
 Requesting: Adderall XR 25mg   Contract: 02/21/24 UDS: 02/21/24 Last Visit: 02/20/24 Next Visit: None Last Refill: 04/20/24 #30 and 0RF   Please Advise

## 2024-07-18 DIAGNOSIS — M19072 Primary osteoarthritis, left ankle and foot: Secondary | ICD-10-CM | POA: Diagnosis not present

## 2024-11-21 DIAGNOSIS — N898 Other specified noninflammatory disorders of vagina: Secondary | ICD-10-CM | POA: Diagnosis not present

## 2024-11-21 DIAGNOSIS — N951 Menopausal and female climacteric states: Secondary | ICD-10-CM | POA: Diagnosis not present

## 2024-11-21 DIAGNOSIS — Z30430 Encounter for insertion of intrauterine contraceptive device: Secondary | ICD-10-CM | POA: Diagnosis not present

## 2024-12-22 ENCOUNTER — Telehealth: Payer: Self-pay | Admitting: Nurse Practitioner

## 2024-12-22 DIAGNOSIS — J069 Acute upper respiratory infection, unspecified: Secondary | ICD-10-CM

## 2024-12-22 MED ORDER — PROMETHAZINE-DM 6.25-15 MG/5ML PO SYRP
5.0000 mL | ORAL_SOLUTION | Freq: Four times a day (QID) | ORAL | 0 refills | Status: DC | PRN
  Filled 2024-12-22: qty 240, 12d supply, fill #0

## 2024-12-22 MED ORDER — AZITHROMYCIN 250 MG PO TABS
ORAL_TABLET | ORAL | 0 refills | Status: DC
  Filled 2024-12-22: qty 6, 5d supply, fill #0

## 2024-12-22 MED ORDER — LIDOCAINE VISCOUS HCL 2 % MT SOLN
15.0000 mL | OROMUCOSAL | 0 refills | Status: DC | PRN
  Filled 2024-12-22: qty 100, 7d supply, fill #0

## 2024-12-22 NOTE — Progress Notes (Signed)
 We are sorry you are not feeling well.  Here is how we plan to help!  Based on what you have shared with me, it looks like you may have a viral upper respiratory infection.  Upper respiratory infections are caused by a large number of viruses; however, rhinovirus is the most common cause.   Symptoms vary from person to person, with common symptoms including sore throat, cough, and fatigue or lack of energy, and a feeling of general discomfort.  A low-grade fever of up to 100.4 may present, but is often uncommon.  Symptoms vary however, and are closely related to a person's age or underlying illnesses.  The most common symptoms associated with an upper respiratory infection are nasal discharge or congestion, cough, sneezing, headache and pressure in the ears and face.  These symptoms usually persist for about 3 to 10 days, but can last up to 2 weeks.  It is important to know that upper respiratory infections do not cause serious illness or complications in most cases.    Upper respiratory infections can be transmitted from person to person, with the most common method of transmission being a person's hands.  The virus is able to live on the skin and can infect other persons for up to 2 hours after direct contact.  Also, these can be transmitted when someone coughs or sneezes; thus, it is important to cover the mouth to reduce this risk.  To keep the spread of the illness at bay, good hand hygiene is very important!  Because this is a viral infection, there are no specific treatments other than to help you with the symptoms until the infection runs its course.    For nasal congestion, you may use an oral decongestants such as Mucinex D or if you have glaucoma or high blood pressure use plain Mucinex.  Saline nasal spray or nasal drops can help and can safely be used as often as needed for congestion.     If you do not have a history of heart disease, hypertension, diabetes or thyroid  disease,  prostate/bladder issues or glaucoma, you may also use Sudafed to treat nasal congestion.  It is highly recommended that you consult with a pharmacist or your primary care physician to ensure this medication is safe for you to take.     If you have a cough, you may use over-the-counter cough suppressants such as Delsym and Robitussin.  If you have glaucoma or high blood pressure, you can also use Coricidin HBP.   For cough I have prescribed for you a zpak and cough syrup.  If you have a sore or scratchy throat, use a saltwater gargle-  to  teaspoon of salt dissolved in a 4-ounce to 8-ounce glass of warm water.  Gargle the solution for approximately 15-30 seconds and then spit.  It is important not to swallow the solution.  You can also use throat lozenges/cough drops and Chloraseptic spray to help with throat pain or discomfort.  Warm or cold liquids can also be helpful in relieving throat pain. I have also prescribed I have prescribed a Viscous Lidocaine  2% solution. Swallow 5-10 mL every 4-6 hours as needed for sore throat. DO NOT eat or drink anything for 15-20 minutes after swallowing to allow the medication to coat the throat.  For headache, pain, or general discomfort, you can use Ibuprofen  or Tylenol  as directed.   Some authorities believe that zinc sprays or the use of Echinacea may shorten the course of your symptoms.  HOME CARE Only take medications as instructed by your medical team. Be sure to drink plenty of fluids. Water is fine as well as fruit juices, sodas and electrolyte beverages. You may want to stay away from caffeine or alcohol. If you are nauseated, try taking small sips of liquids. How do you know if you are getting enough fluid? Your urine should be a pale yellow or almost colorless. Get rest. Taking a steamy shower or using a humidifier may help nasal congestion and ease sore throat pain. You can place a towel over your head and breathe in the steam from hot water coming  from a faucet. Using a saline nasal spray works much the same way. Cough drops, hard candies and sore throat lozenges may ease your cough. Avoid close contacts especially the very young and the elderly Cover your mouth if you cough or sneeze Always remember to wash your hands.   GET HELP RIGHT AWAY IF: You develop worsening fever. If your symptoms do not improve within 10 days You develop yellow or green discharge from your nose over 3 days. You have coughing fits You develop a severe head ache or visual changes. You develop shortness of breath, difficulty breathing or start having chest pain Your symptoms persist after you have completed your treatment plan  MAKE SURE YOU  Understand these instructions. Will watch your condition. Will get help right away if you are not doing well or get worse.  Your e-visit answers were reviewed by a board certified advanced clinical practitioner to complete your personal care plan. Depending upon the condition, your plan could have included both over-the-counter or prescription medications.  Please review your pharmacy choice. If there is a problem, you may call our nursing hot line at and have the prescription routed to another pharmacy. Your safety is important to us . If you have drug allergies, check your prescription carefully.   You can use MyChart to ask questions about todays visit, request a non-urgent call back, or ask for a work or school excuse for 24 hours related to this e-Visit. If it has been greater than 24 hours you will need to follow up with your provider, or enter a new e-Visit to address those concerns. You will get an e-mail in the next two days asking about your experience.  I hope that your e-visit has been valuable and will speed your recovery. Thank you for using e-visits.   I have spent 5 minutes in review of e-visit questionnaire, review and updating patient chart, medical decision making and response to patient.   Atarah Cadogan  W Masahiro Iglesia, NP

## 2024-12-23 ENCOUNTER — Other Ambulatory Visit (HOSPITAL_BASED_OUTPATIENT_CLINIC_OR_DEPARTMENT_OTHER): Payer: Self-pay

## 2024-12-24 ENCOUNTER — Telehealth: Payer: Self-pay | Admitting: *Deleted

## 2024-12-24 ENCOUNTER — Other Ambulatory Visit (HOSPITAL_BASED_OUTPATIENT_CLINIC_OR_DEPARTMENT_OTHER): Payer: Self-pay

## 2024-12-24 DIAGNOSIS — J069 Acute upper respiratory infection, unspecified: Secondary | ICD-10-CM

## 2024-12-24 MED ORDER — PROMETHAZINE-DM 6.25-15 MG/5ML PO SYRP: 5.0000 mL | ORAL_SOLUTION | Freq: Four times a day (QID) | ORAL | 0 refills | Status: AC | PRN

## 2024-12-24 MED ORDER — AZITHROMYCIN 250 MG PO TABS: ORAL_TABLET | ORAL | 0 refills | Status: AC

## 2024-12-24 MED ORDER — LIDOCAINE VISCOUS HCL 2 % MT SOLN: 15.0000 mL | OROMUCOSAL | 0 refills | Status: AC | PRN

## 2024-12-24 NOTE — Telephone Encounter (Signed)
 Pt had an E-Visit on 12/22/24 and they sent her medications into [facility] pharmacy and she needed it filled at the [facility].  She had them cancel rx at [facility] so unable to advised to have [facility] call them.  Spoke with Dr. Antonio and she was ok with resending rxs under her name.  Pt notified.

## 2024-12-24 NOTE — Telephone Encounter (Signed)
 Copied from CRM [id number]. Topic: Clinical - Prescription Issue >> Dec 24, 2024  9:06 AM Nicole Drake wrote: Reason for CRM: prescriptions sent to wrong pharmacy. Please send to  [facility] [place] [address]), [place] - [address] [place] [id number]) [place] [address] Phone: [phone] Fax: [phone] Hours: Not open 24 hours  promethazine -dextromethorphan (PROMETHAZINE -DM) 6.25-15 MG/5ML syrup [[id number]]   lidocaine  (XYLOCAINE ) 2 % solution [[id number]]  azithromycin  (ZITHROMAX ) 250 MG tablet [[id number]]

## 2025-01-11 ENCOUNTER — Other Ambulatory Visit: Payer: Self-pay

## 2025-01-11 DIAGNOSIS — F988 Other specified behavioral and emotional disorders with onset usually occurring in childhood and adolescence: Secondary | ICD-10-CM

## 2025-01-11 MED ORDER — AMPHETAMINE-DEXTROAMPHET ER 25 MG PO CP24: 25.0000 mg | ORAL_CAPSULE | ORAL | 0 refills | Status: AC

## 2025-01-11 NOTE — Telephone Encounter (Signed)
 Copied from CRM [id number]. Topic: Clinical - Medication Refill >> Jan 11, 2025  9:36 AM Maisie BROCKS wrote: Medication: ADDERALL  Has the patient contacted their pharmacy? Yes (Agent: If no, request that the patient contact the pharmacy for the refill. If patient does not wish to contact the pharmacy document the reason why and proceed with request.) (Agent: If yes, when and what did the pharmacy advise?)  This is the patient's preferred pharmacy:  [facility] [address] [place]), [place] - [address] [place]) [place] [address] Phone: [phone] Fax: [phone]  Is this the correct pharmacy for this prescription? Yes If no, delete pharmacy and type the correct one.   Has the prescription been filled recently? Yes  Is the patient out of the medication? Yes  Has the patient been seen for an appointment in the last year OR does the patient have an upcoming appointment? No  Can we respond through MyChart? Yes  Agent: Please be advised that Rx refills may take up to 3 business days. We ask that you follow-up with your pharmacy.

## 2025-01-11 NOTE — Telephone Encounter (Signed)
 Requesting: Adderall  Contract: 02/21/2024 UDS: 02/21/2024 Last OV: 02/20/2024 Next OV: n/a Last Refill: 06/14/2024, #30--0 RF Database:   Please advise
